# Patient Record
Sex: Female | Born: 1985 | Race: Black or African American | Hispanic: No | Marital: Married | Smoking: Never smoker
Health system: Southern US, Community
[De-identification: ages and names within clinical notes are randomized; demographics above are authoritative.]

## PROBLEM LIST (undated history)

## (undated) DIAGNOSIS — N939 Abnormal uterine and vaginal bleeding, unspecified: Secondary | ICD-10-CM

## (undated) DIAGNOSIS — A749 Chlamydial infection, unspecified: Secondary | ICD-10-CM

## (undated) DIAGNOSIS — Z9851 Tubal ligation status: Secondary | ICD-10-CM

## (undated) DIAGNOSIS — T7840XA Allergy, unspecified, initial encounter: Secondary | ICD-10-CM

## (undated) DIAGNOSIS — M419 Scoliosis, unspecified: Secondary | ICD-10-CM

## (undated) DIAGNOSIS — A6000 Herpesviral infection of urogenital system, unspecified: Secondary | ICD-10-CM

## (undated) DIAGNOSIS — D509 Iron deficiency anemia, unspecified: Secondary | ICD-10-CM

## (undated) DIAGNOSIS — O365990 Maternal care for other known or suspected poor fetal growth, unspecified trimester, not applicable or unspecified: Secondary | ICD-10-CM

## (undated) DIAGNOSIS — Z3480 Encounter for supervision of other normal pregnancy, unspecified trimester: Secondary | ICD-10-CM

## (undated) DIAGNOSIS — G43909 Migraine, unspecified, not intractable, without status migrainosus: Secondary | ICD-10-CM

## (undated) HISTORY — DX: Scoliosis, unspecified: M41.9

## (undated) HISTORY — PX: MYRINGOTOMY WITH TUBE PLACEMENT: SHX5663

## (undated) HISTORY — DX: Allergy, unspecified, initial encounter: T78.40XA

## (undated) HISTORY — DX: Chlamydial infection, unspecified: A74.9

## (undated) HISTORY — DX: Herpesviral infection of urogenital system, unspecified: A60.00

## (undated) HISTORY — DX: Migraine, unspecified, not intractable, without status migrainosus: G43.909

---

## 2008-05-11 DIAGNOSIS — Z8619 Personal history of other infectious and parasitic diseases: Secondary | ICD-10-CM

## 2008-05-11 HISTORY — DX: Personal history of other infectious and parasitic diseases: Z86.19

## 2008-11-15 ENCOUNTER — Emergency Department (HOSPITAL_COMMUNITY): Admission: EM | Admit: 2008-11-15 | Discharge: 2008-12-02 | Payer: Self-pay | Admitting: Emergency Medicine

## 2009-03-08 ENCOUNTER — Encounter: Admission: RE | Admit: 2009-03-08 | Discharge: 2009-03-08 | Payer: Self-pay | Admitting: Internal Medicine

## 2010-08-19 ENCOUNTER — Inpatient Hospital Stay (HOSPITAL_COMMUNITY)
Admission: AD | Admit: 2010-08-19 | Discharge: 2010-08-21 | DRG: 372 | Disposition: A | Discharge status: Home / Self Care | Payer: BC Managed Care – PPO | Source: Ambulatory Visit | Attending: Obstetrics and Gynecology | Admitting: Obstetrics and Gynecology

## 2010-08-19 DIAGNOSIS — O4290 Premature rupture of membranes, unspecified as to length of time between rupture and onset of labor, unspecified weeks of gestation: Principal | ICD-10-CM | POA: Diagnosis present

## 2010-08-19 DIAGNOSIS — Z22330 Carrier of Group B streptococcus: Secondary | ICD-10-CM

## 2010-08-19 DIAGNOSIS — O99892 Other specified diseases and conditions complicating childbirth: Secondary | ICD-10-CM | POA: Diagnosis present

## 2010-08-19 LAB — CBC
Hemoglobin: 12.2 g/dL (ref 12.0–15.0)
MCH: 28 pg (ref 26.0–34.0)
MCHC: 32.4 g/dL (ref 30.0–36.0)
MCV: 86.7 fL (ref 78.0–100.0)

## 2010-08-21 LAB — CBC
HCT: 34.8 % — ABNORMAL LOW (ref 36.0–46.0)
Platelets: 199 10*3/uL (ref 150–400)
RBC: 4.03 MIL/uL (ref 3.87–5.11)

## 2011-09-14 ENCOUNTER — Telehealth: Payer: Self-pay

## 2011-09-14 MED ORDER — NORGESTIMATE-ETH ESTRADIOL 0.25-35 MG-MCG PO TABS: 1.0000 | ORAL_TABLET | Freq: Every day | ORAL | Status: DC

## 2011-09-14 NOTE — Telephone Encounter (Signed)
rx'd

## 2011-09-14 NOTE — Telephone Encounter (Signed)
Called [facility] and was told that pt was on Errin prior to a couple of months ago but was switched dt back order on Errin, but pt would have been on Errin while breastfeeding. Only other record we have before pregnancy was orthocyclen 1 QD Rx'd for 1 year on Mar 05, 2011. Verify w/pt if this is the BCP she would like to switch back to. LMOM for pt to CB.

## 2011-09-14 NOTE — Telephone Encounter (Signed)
Patient aware.

## 2011-09-14 NOTE — Telephone Encounter (Signed)
PT STATES SHE WAS GIVEN A DIFFERENT KIND OF BCP'S SINCE SHE WAS BREASTFEEDING, SHE HAVE FINISHED BREAST FEEDING AND WOULD LIKE TO GO BACK TO THE ORIGINAL ONES SHE TOOK BEFORE HER BABY. SHE COULDN'T THINK OF THE NAME OF IT AND THE PHARMACY WOULDN'T SURE SCRIPT FOR HER PLEASE CALL [phone]    [facility] ON [address]

## 2011-09-14 NOTE — Telephone Encounter (Signed)
Spoke with patient and she would like rx for the Ortho cyclen rx'd to [facility].  She spoke with the pharm and they state that she did not have any refilled... ?d'c'd when changed to Errin?  Ok to rx through October?  Patient will recheck then for her yearly.Marland KitchenMarland Kitchen

## 2011-09-19 ENCOUNTER — Other Ambulatory Visit: Payer: Self-pay | Admitting: Family Medicine

## 2012-01-12 ENCOUNTER — Ambulatory Visit (INDEPENDENT_AMBULATORY_CARE_PROVIDER_SITE_OTHER): Payer: BC Managed Care – PPO | Admitting: Family Medicine

## 2012-01-12 VITALS — BP 114/77 | HR 80 | Temp 98.9°F | Resp 16 | Ht 65.5 in | Wt 154.0 lb

## 2012-01-12 DIAGNOSIS — R300 Dysuria: Secondary | ICD-10-CM

## 2012-01-12 DIAGNOSIS — N210 Calculus in bladder: Secondary | ICD-10-CM

## 2012-01-12 DIAGNOSIS — R350 Frequency of micturition: Secondary | ICD-10-CM

## 2012-01-12 DIAGNOSIS — N390 Urinary tract infection, site not specified: Secondary | ICD-10-CM | POA: Insufficient documentation

## 2012-01-12 LAB — POCT URINALYSIS DIPSTICK
Glucose, UA: 250
Spec Grav, UA: 1.01

## 2012-01-12 LAB — POCT UA - MICROSCOPIC ONLY
Bacteria, U Microscopic: NEGATIVE
Crystals, Ur, HPF, POC: NEGATIVE
Mucus, UA: NEGATIVE
Yeast, UA: NEGATIVE

## 2012-01-12 MED ORDER — CEPHALEXIN 500 MG PO CAPS: 500.0000 mg | ORAL_CAPSULE | Freq: Two times a day (BID) | ORAL | Status: AC

## 2012-01-12 NOTE — Patient Instructions (Addendum)
Thank you for coming in today. Take the keflex twice a day for 1 week. Let us know if you get worse or have bad fevers or chills or belly or back pain.

## 2012-01-12 NOTE — Progress Notes (Signed)
Ashley [name] is a 26 y.o. female who presents to [facility] today for urinary tract infection symptoms. Started yesterday patient notes pelvic pain urinary frequency and urgency. Her symptoms improved after taking AZO.  She feels well denies any fevers chills significant abdominal or flank pain. She has a history of one prior UTI. This is consistent with her symptoms.   PMH: Reviewed otherwise healthy History  Substance Use Topics  . Smoking status: Never Smoker   . Smokeless tobacco: Not on file  . Alcohol Use: Not on file   ROS as above  Medications reviewed. Current Outpatient Prescriptions  Medication Sig Dispense Refill  . norgestimate-ethinyl estradiol (ORTHO-CYCLEN,SPRINTEC,PREVIFEM) 0.25-35 MG-MCG tablet Take 1 tablet by mouth daily.  1 Package  4  . cephALEXin (KEFLEX) 500 MG capsule Take 1 capsule (500 mg total) by mouth 2 (two) times daily.  14 capsule  0    Exam:  BP 114/77  Pulse 80  Temp 98.9 F (37.2 C)  Resp 16  Ht 5' 5.5" (1.664 m)  Wt 154 lb (69.854 kg)  BMI 25.24 kg/m2  LMP 12/29/2011 Gen: Well NAD HEENT: EOMI,  MMM Lungs: CTABL Nl WOB Heart: RRR no MRG Abd: NABS, NT, ND Exts: Non edematous BL  LE, warm and well perfused.   Results for orders placed in visit on 01/12/12 (from the past 72 hour(s))  POCT URINALYSIS DIPSTICK     Status: Normal   Collection Time   01/12/12  6:43 PM      Component Value Range Comment   Color, UA orange      Clarity, UA clear      Glucose, UA 250      Bilirubin, UA small      Ketones, UA trace      Spec Grav, UA 1.010      Blood, UA trace      pH, UA 5.0      Protein, UA 100      Urobilinogen, UA 4.0      Nitrite, UA postive      Leukocytes, UA large (3+)     POCT UA - MICROSCOPIC ONLY     Status: Normal   Collection Time   01/12/12  6:43 PM      Component Value Range Comment   WBC, Ur, HPF, POC 10-12      RBC, urine, microscopic 0-3      Bacteria, U Microscopic neg      Mucus, UA neg      Epithelial cells, urine per micros  2-4      Crystals, Ur, HPF, POC neg      Casts, Ur, LPF, POC neg      Yeast, UA neg     POCT URINE PREGNANCY     Status: Normal   Collection Time   01/12/12  6:45 PM      Component Value Range Comment   Preg Test, Ur Negative       Assessment and Plan: 26 y.o. female with UTI. Based on urine microscopy and dipstick. Plan to treat with Keflex twice daily for one week. Discussed warning signs or symptoms followup as needed. Patient expresses understanding.

## 2012-01-23 ENCOUNTER — Telehealth: Payer: Self-pay

## 2012-01-23 MED ORDER — FLUCONAZOLE 150 MG PO TABS: 150.0000 mg | ORAL_TABLET | Freq: Once | ORAL | Status: AC

## 2012-01-23 NOTE — Telephone Encounter (Signed)
Diflucan prescription sent to pharmacy.  Tell her to take it today and it will take several days for her symptoms to resolve

## 2012-01-23 NOTE — Telephone Encounter (Signed)
Pt was treated here for a UTI and now has a yeast infection wants to know if she can get an antibiotic Pt uses [facility] [address]

## 2012-01-23 NOTE — Telephone Encounter (Signed)
LMOM RX sent to pharmacy 

## 2012-01-25 ENCOUNTER — Other Ambulatory Visit: Payer: Self-pay | Admitting: Physician Assistant

## 2012-03-09 ENCOUNTER — Ambulatory Visit (INDEPENDENT_AMBULATORY_CARE_PROVIDER_SITE_OTHER): Payer: BC Managed Care – PPO | Admitting: Family Medicine

## 2012-03-09 ENCOUNTER — Ambulatory Visit: Payer: BC Managed Care – PPO

## 2012-03-09 VITALS — BP 124/76 | HR 71 | Temp 98.3°F | Resp 17 | Ht 66.0 in | Wt 154.0 lb

## 2012-03-09 DIAGNOSIS — Z01419 Encounter for gynecological examination (general) (routine) without abnormal findings: Secondary | ICD-10-CM

## 2012-03-09 DIAGNOSIS — B373 Candidiasis of vulva and vagina: Secondary | ICD-10-CM

## 2012-03-09 DIAGNOSIS — M545 Low back pain: Secondary | ICD-10-CM

## 2012-03-09 DIAGNOSIS — L259 Unspecified contact dermatitis, unspecified cause: Secondary | ICD-10-CM

## 2012-03-09 DIAGNOSIS — R42 Dizziness and giddiness: Secondary | ICD-10-CM

## 2012-03-09 DIAGNOSIS — Z0000 Encounter for general adult medical examination without abnormal findings: Secondary | ICD-10-CM

## 2012-03-09 LAB — POCT URINALYSIS DIPSTICK
Bilirubin, UA: NEGATIVE
Glucose, UA: NEGATIVE
Leukocytes, UA: NEGATIVE
Urobilinogen, UA: 1

## 2012-03-09 LAB — POCT CBC
Granulocyte percent: 52.7 %G (ref 37–80)
Hemoglobin: 12.4 g/dL (ref 12.2–16.2)
Lymph, poc: 2.6 (ref 0.6–3.4)
MCH, POC: 26.4 pg — AB (ref 27–31.2)
MCV: 86.8 fL (ref 80–97)
MID (cbc): 0.5 (ref 0–0.9)
POC Granulocyte: 3.4 (ref 2–6.9)
POC LYMPH PERCENT: 39.3 %L (ref 10–50)
RDW, POC: 13.2 %
WBC: 6.5 10*3/uL (ref 4.6–10.2)

## 2012-03-09 LAB — POCT GLYCOSYLATED HEMOGLOBIN (HGB A1C): Hemoglobin A1C: 4.9

## 2012-03-09 LAB — POCT UA - MICROSCOPIC ONLY

## 2012-03-09 LAB — POCT WET PREP WITH KOH

## 2012-03-09 MED ORDER — NORGESTIMATE-ETH ESTRADIOL 0.25-35 MG-MCG PO TABS: 1.0000 | ORAL_TABLET | Freq: Every day | ORAL | Status: DC

## 2012-03-09 NOTE — Progress Notes (Signed)
[address]   [place], [place]  [address]   [phone]  Subjective:    Patient ID: Ashley [name], female    DOB: August 04, 1985, 26 y.o.   MRN: [id number]  HPIThis 26 y.o. female presents for evaluation for CPE.  Last CPE 03/03/11.  Last pap smear 02/2011 normal.  Mammogram never; breast u/s  2010 for cysts.  Colonoscopy never.  TDAP 2010.  Hepatitis B series in school.  Gardisil series never. Influenza never.  Eye exam several years; no glasses.  Dental exam 2-3 years.  Abnormal pap smear 2006; repeat pap normal.  1.  Contraception:  Has suffered with recurrent yeast infections since birth of daughter.  Now taking new contraception.  Before pregnancy took a different OCP.   2. Back pain: has suffered with back pain since birth of daughter; only occurs at night.  Lower back.  No radiation into legs.  No associated n/t/w.  No b/b dysfunction; no saddle paresthesias. 3.  Light headed, dizziness: onset two days ago. Wants iron checked.  No syncope.  No head congestion. 4.  Rash L arm: presented to [facility]; prescribed cream with recurrence.  Switched watch.  Mild itching.     Review of Systems  Constitutional: Negative for fever, chills, diaphoresis, activity change, appetite change, fatigue and unexpected weight change.  HENT: Negative for hearing loss, ear pain, nosebleeds, congestion, sore throat, facial swelling, rhinorrhea, sneezing, drooling, mouth sores, trouble swallowing, neck pain, neck stiffness, dental problem, voice change, postnasal drip, tinnitus and ear discharge.   Eyes: Negative for photophobia, pain, discharge, redness, itching and visual disturbance.  Respiratory: Negative for apnea, cough, choking, chest tightness, shortness of breath, wheezing and stridor.   Cardiovascular: Negative for chest pain, palpitations and leg swelling.  Gastrointestinal: Negative for nausea, vomiting, abdominal pain, diarrhea, constipation, blood in stool, abdominal distention, anal bleeding and  rectal pain.  Genitourinary: Positive for vaginal discharge. Negative for dysuria, urgency, frequency, hematuria, flank pain, decreased urine volume, vaginal bleeding, enuresis, difficulty urinating, genital sores, vaginal pain, menstrual problem, pelvic pain and dyspareunia.  Musculoskeletal: Positive for back pain. Negative for joint swelling, arthralgias and gait problem.  Skin: Positive for rash. Negative for color change, pallor and wound.  Neurological: Positive for dizziness and light-headedness. Negative for tremors, seizures, syncope, facial asymmetry, speech difficulty, weakness, numbness and headaches.  Hematological: Negative for adenopathy. Does not bruise/bleed easily.  Psychiatric/Behavioral: Negative for suicidal ideas, hallucinations, behavioral problems, confusion, disturbed wake/sleep cycle, self-injury, dysphoric mood, decreased concentration and agitation. The patient is not nervous/anxious and is not hyperactive.         Past Medical History  Diagnosis Date  . Allergy     Zyrtec daily.  . Genital herpes     HSV II.  . Migraine   . Chlamydia 05/11/2008    Past Surgical History  Procedure Date  . Myringotomy with tube placement     Prior to Admission medications   Medication Sig Start Date End Date Taking? Authorizing Provider  norgestimate-ethinyl estradiol (ORTHO-CYCLEN, 28,) 0.25-35 MG-MCG tablet Take 1 tablet by mouth daily. PLEASE PLACE ON HOLD. 03/09/12   Ethelda Chick, MD    No Known Allergies  History   Social History  . Marital Status: Married    Spouse Name: N/A    Number of Children: N/A  . Years of Education: N/A   Occupational History  . Not on file.   Social History Main Topics  . Smoking status: Never Smoker   . Smokeless tobacco:  Not on file  . Alcohol Use: No  . Drug Use: Not on file  . Sexually Active: Yes    Birth Control/ Protection: Pill   Other Topics Concern  . Not on file   Social History Narrative   Marital status:  married x 2 years; happily married; no abuse.    Children: 1 daughter (37 months)   Lives: with husband, daughter.  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  Tobacco: none   Alcohol: none   Drugs: none   Exercise:       Family History  Problem Relation Age of Onset  . Hypertension Mother   . Asthma Mother   . Hypertension Maternal Grandmother     Objective:   Physical Exam  Nursing note reviewed. Constitutional: She is oriented to person, place, and time. She appears well-developed and well-nourished. No distress.  HENT:  Head: Normocephalic and atraumatic.  Right Ear: External ear normal.  Left Ear: External ear normal.  Nose: Nose normal.  Mouth/Throat: Oropharynx is clear and moist.  Eyes: Conjunctivae normal and EOM are normal. Pupils are equal, round, and reactive to light.  Neck: Normal range of motion. Neck supple. No JVD present. No thyromegaly present.  Cardiovascular: Normal rate, regular rhythm and normal heart sounds.  Exam reveals no gallop and no friction rub.   No murmur heard. Pulmonary/Chest: Effort normal and breath sounds normal. No respiratory distress. She has no wheezes. She has no rales.  Abdominal: Soft. Bowel sounds are normal. She exhibits no distension and no mass. There is no tenderness. There is no rebound and no guarding. Hernia confirmed negative in the right inguinal area and confirmed negative in the left inguinal area.  Genitourinary: Uterus normal. No breast swelling, tenderness or discharge. There is no rash, tenderness or lesion on the right labia. There is no rash, tenderness or lesion on the left labia. Cervix exhibits discharge. Cervix exhibits no motion tenderness and no friability. Right adnexum displays no mass, no tenderness and no fullness. Left adnexum displays no mass, no tenderness and no fullness. No erythema, tenderness or bleeding around the vagina. No foreign body around the vagina. Vaginal discharge found.  Musculoskeletal:  Normal range of motion. She exhibits no tenderness.       Lumbar back: She exhibits no tenderness, no bony tenderness, no swelling, no pain and no spasm.       LUMBAR SPINE: NON-TENDER; STRAIGHT LEG RAISES NEGATIVE; MOTOR 5/5.  Lymphadenopathy:    She has no cervical adenopathy.       Right: No inguinal adenopathy present.       Left: No inguinal adenopathy present.  Neurological: She is alert and oriented to person, place, and time. She has normal reflexes. No cranial nerve deficit. She exhibits normal muscle tone. Coordination normal.  Skin: Skin is warm and dry. She is not diaphoretic.       +erythematous rash wrist.  Psychiatric: She has a normal mood and affect. Her behavior is normal. Judgment and thought content normal.    Results for orders placed in visit on 03/09/12  POCT CBC      Component Value Range   WBC 6.5  4.6 - 10.2 K/uL   Lymph, poc 2.6  0.6 - 3.4   POC LYMPH PERCENT 39.3  10 - 50 %L   MID (cbc) 0.5  0 - 0.9   POC MID % 8.0  0 - 12 %M   POC Granulocyte 3.4  2 - 6.9  Granulocyte percent 52.7  37 - 80 %G   RBC 4.70  4.04 - 5.48 M/uL   Hemoglobin 12.4  12.2 - 16.2 g/dL   HCT, POC 40.9  81.1 - 47.9 %   MCV 86.8  80 - 97 fL   MCH, POC 26.4 (*) 27 - 31.2 pg   MCHC 30.4 (*) 31.8 - 35.4 g/dL   RDW, POC 91.4     Platelet Count, POC 294  142 - 424 K/uL   MPV 8.6  0 - 99.8 fL  POCT GLYCOSYLATED HEMOGLOBIN (HGB A1C)      Component Value Range   Hemoglobin A1C 4.9    POCT UA - MICROSCOPIC ONLY      Component Value Range   WBC, Ur, HPF, POC 0-2     RBC, urine, microscopic neg     Bacteria, U Microscopic neg     Mucus, UA neg     Epithelial cells, urine per micros 0-2     Crystals, Ur, HPF, POC neg     Casts, Ur, LPF, POC neg     Yeast, UA neg    POCT URINALYSIS DIPSTICK      Component Value Range   Color, UA yellow     Clarity, UA slightly cloudy     Glucose, UA neg     Bilirubin, UA neg     Ketones, UA neg     Spec Grav, UA 1.020     Blood, UA neg     pH,  UA 7.5     Protein, UA trace     Urobilinogen, UA 1.0     Nitrite, UA neg     Leukocytes, UA Negative    POCT WET PREP WITH KOH      Component Value Range   Trichomonas, UA Negative     Clue Cells Wet Prep HPF POC 1-3     Epithelial Wet Prep HPF POC 3-6     Yeast Wet Prep HPF POC neg     Bacteria Wet Prep HPF POC 2+     RBC Wet Prep HPF POC 0-1     WBC Wet Prep HPF POC 2-4     KOH Prep POC Negative     [facility] reading (PRIMARY) by  Dr. Katrinka Blazing.  LS spine: scoliosis; narrowing/degenerative changes L2-3, L3-4.      Assessment & Plan:   1. Routine general medical examination at a health care facility  POCT CBC, POCT glycosylated hemoglobin (Hb A1C), POCT UA - Microscopic Only, POCT urinalysis dipstick, Comprehensive metabolic panel, Vitamin D 25 hydroxy, Vitamin B12, TSH, Iron, IBC Panel  2. Routine gynecological examination  Pap IG and HPV (high risk) DNA detection  3. Low back pain  DG Lumbar Spine 2-3 Views  4. Candidiasis of female genitalia  POCT glycosylated hemoglobin (Hb A1C), POCT Wet Prep with KOH  5. Dizziness  Iron, IBC Panel  6. Contact dermatitis       1. CPE:  Anticipatory guidance --- exercise.  Pap smear obtained; obtain labs.  Immunizations recommended; pt declined flu vaccine. 2.  Gynecological exam:  Pap smear obtained.  Rx for contraception provided. 3. Low back pain: New; onset after childbirth.  Exercises provided to perform daily. If no improvement in upcoming 1-3 months, call for ortho referral. 4. Dizziness: New.  CBC normal; obtain labs.  Increase fluid intake.  Normal neuro exam. 6. Contact dermatitis:  New.  To call with rx previously prescribed.     Meds ordered this  encounter  Medications  . norgestimate-ethinyl estradiol (ORTHO-CYCLEN, 28,) 0.25-35 MG-MCG tablet    Sig: Take 1 tablet by mouth daily. PLEASE PLACE ON HOLD.    Dispense:  1 Package    Refill:  11

## 2012-03-09 NOTE — Patient Instructions (Addendum)
1. Routine general medical examination at a health care facility  POCT CBC, POCT glycosylated hemoglobin (Hb A1C), POCT UA - Microscopic Only, POCT urinalysis dipstick, Comprehensive metabolic panel, Vitamin D 25 hydroxy, Vitamin B12, TSH, Iron, IBC Panel  2. Routine gynecological examination  Pap IG and HPV (high risk) DNA detection  3. Low back pain  DG Lumbar Spine 2-3 Views  4. Candidiasis of female genitalia  POCT glycosylated hemoglobin (Hb A1C), POCT Wet Prep with KOH  5. Dizziness  Iron, IBC Panel  6. Contact dermatitis     Low Back Sprain with Rehab  A sprain is an injury in which a ligament is torn. The ligaments of the lower back are vulnerable to sprains. However, they are strong and require great force to be injured. These ligaments are important for stabilizing the spinal column. Sprains are classified into three categories. Grade 1 sprains cause pain, but the tendon is not lengthened. Grade 2 sprains include a lengthened ligament, due to the ligament being stretched or partially ruptured. With grade 2 sprains there is still function, although the function may be decreased. Grade 3 sprains involve a complete tear of the tendon or muscle, and function is usually impaired. SYMPTOMS   Severe pain in the lower back.  Sometimes, a feeling of a "pop," "snap," or tear, at the time of injury.  Tenderness and sometimes swelling at the injury site.  Uncommonly, bruising (contusion) within 48 hours of injury.  Muscle spasms in the back. CAUSES  Low back sprains occur when a force is placed on the ligaments that is greater than they can handle. Common causes of injury include:  Performing a stressful act while off-balance.  Repetitive stressful activities that involve movement of the lower back.  Direct hit (trauma) to the lower back. RISK INCREASES WITH:  Contact sports (football, wrestling).  Collisions (major skiing accidents).  Sports that require throwing or lifting  (baseball, weightlifting).  Sports involving twisting of the spine (gymnastics, diving, tennis, golf).  Poor strength and flexibility.  Inadequate protection.  Previous back injury or surgery (especially fusion). PREVENTION  Wear properly fitted and padded protective equipment.  Warm up and stretch properly before activity.  Allow for adequate recovery between workouts.  Maintain physical fitness:  Strength, flexibility, and endurance.  Cardiovascular fitness.  Maintain a healthy body weight. PROGNOSIS  If treated properly, low back sprains usually heal with non-surgical treatment. The length of time for healing depends on the severity of the injury.  RELATED COMPLICATIONS   Recurring symptoms, resulting in a chronic problem.  Chronic inflammation and pain in the low back.  Delayed healing or resolution of symptoms, especially if activity is resumed too soon.  Prolonged impairment.  Unstable or arthritic joints of the low back. TREATMENT  Treatment first involves the use of ice and medicine, to reduce pain and inflammation. The use of strengthening and stretching exercises may help reduce pain with activity. These exercises may be performed at home or with a therapist. Severe injuries may require referral to a therapist for further evaluation and treatment, such as ultrasound. Your caregiver may advise that you wear a back brace or corset, to help reduce pain and discomfort. Often, prolonged bed rest results in greater harm then benefit. Corticosteroid injections may be recommended. However, these should be reserved for the most serious cases. It is important to avoid using your back when lifting objects. At night, sleep on your back on a firm mattress, with a pillow placed under your knees. If  non-surgical treatment is unsuccessful, surgery may be needed.  MEDICATION   If pain medicine is needed, nonsteroidal anti-inflammatory medicines (aspirin and ibuprofen), or other  minor pain relievers (acetaminophen), are often advised.  Do not take pain medicine for 7 days before surgery.  Prescription pain relievers may be given, if your caregiver thinks they are needed. Use only as directed and only as much as you need.  Ointments applied to the skin may be helpful.  Corticosteroid injections may be given by your caregiver. These injections should be reserved for the most serious cases, because they may only be given a certain number of times. HEAT AND COLD  Cold treatment (icing) should be applied for 10 to 15 minutes every 2 to 3 hours for inflammation and pain, and immediately after activity that aggravates your symptoms. Use ice packs or an ice massage.  Heat treatment may be used before performing stretching and strengthening activities prescribed by your caregiver, physical therapist, or athletic trainer. Use a heat pack or a warm water soak. SEEK MEDICAL CARE IF:   Symptoms get worse or do not improve in 2 to 4 weeks, despite treatment.  You develop numbness or weakness in either leg.  You lose bowel or bladder function.  Any of the following occur after surgery: fever, increased pain, swelling, redness, drainage of fluids, or bleeding in the affected area.  New, unexplained symptoms develop. (Drugs used in treatment may produce side effects.) EXERCISES  RANGE OF MOTION (ROM) AND STRETCHING EXERCISES - Low Back Sprain Most people with lower back pain will find that their symptoms get worse with excessive bending forward (flexion) or arching at the lower back (extension). The exercises that will help resolve your symptoms will focus on the opposite motion.  Your physician, physical therapist or athletic trainer will help you determine which exercises will be most helpful to resolve your lower back pain. Do not complete any exercises without first consulting with your caregiver. Discontinue any exercises which make your symptoms worse, until you speak to  your caregiver. If you have pain, numbness or tingling which travels down into your buttocks, leg or foot, the goal of the therapy is for these symptoms to move closer to your back and eventually resolve. Sometimes, these leg symptoms will get better, but your lower back pain may worsen. This is often an indication of progress in your rehabilitation. Be very alert to any changes in your symptoms and the activities in which you participated in the 24 hours prior to the change. Sharing this information with your caregiver will allow him or her to most efficiently treat your condition. These exercises may help you when beginning to rehabilitate your injury. Your symptoms may resolve with or without further involvement from your physician, physical therapist or athletic trainer. While completing these exercises, remember:   Restoring tissue flexibility helps normal motion to return to the joints. This allows healthier, less painful movement and activity.  An effective stretch should be held for at least 30 seconds.  A stretch should never be painful. You should only feel a gentle lengthening or release in the stretched tissue. FLEXION RANGE OF MOTION AND STRETCHING EXERCISES: STRETCH  Flexion, Single Knee to Chest   Lie on a firm bed or floor with both legs extended in front of you.  Keeping one leg in contact with the floor, bring your opposite knee to your chest. Hold your leg in place by either grabbing behind your thigh or at your knee.  Pull until  you feel a gentle stretch in your low back. Hold __________ seconds.  Slowly release your grasp and repeat the exercise with the opposite side. Repeat __________ times. Complete this exercise __________ times per day.  STRETCH  Flexion, Double Knee to Chest  Lie on a firm bed or floor with both legs extended in front of you.  Keeping one leg in contact with the floor, bring your opposite knee to your chest.  Tense your stomach muscles to support  your back and then lift your other knee to your chest. Hold your legs in place by either grabbing behind your thighs or at your knees.  Pull both knees toward your chest until you feel a gentle stretch in your low back. Hold __________ seconds.  Tense your stomach muscles and slowly return one leg at a time to the floor. Repeat __________ times. Complete this exercise __________ times per day.  STRETCH  Low Trunk Rotation  Lie on a firm bed or floor. Keeping your legs in front of you, bend your knees so they are both pointed toward the ceiling and your feet are flat on the floor.  Extend your arms out to the side. This will stabilize your upper body by keeping your shoulders in contact with the floor.  Gently and slowly drop both knees together to one side until you feel a gentle stretch in your low back. Hold for __________ seconds.  Tense your stomach muscles to support your lower back as you bring your knees back to the starting position. Repeat the exercise to the other side. Repeat __________ times. Complete this exercise __________ times per day  EXTENSION RANGE OF MOTION AND FLEXIBILITY EXERCISES: STRETCH  Extension, Prone on Elbows   Lie on your stomach on the floor, a bed will be too soft. Place your palms about shoulder width apart and at the height of your head.  Place your elbows under your shoulders. If this is too painful, stack pillows under your chest.  Allow your body to relax so that your hips drop lower and make contact more completely with the floor.  Hold this position for __________ seconds.  Slowly return to lying flat on the floor. Repeat __________ times. Complete this exercise __________ times per day.  RANGE OF MOTION  Extension, Prone Press Ups  Lie on your stomach on the floor, a bed will be too soft. Place your palms about shoulder width apart and at the height of your head.  Keeping your back as relaxed as possible, slowly straighten your elbows while  keeping your hips on the floor. You may adjust the placement of your hands to maximize your comfort. As you gain motion, your hands will come more underneath your shoulders.  Hold this position __________ seconds.  Slowly return to lying flat on the floor. Repeat __________ times. Complete this exercise __________ times per day.  RANGE OF MOTION- Quadruped, Neutral Spine   Assume a hands and knees position on a firm surface. Keep your hands under your shoulders and your knees under your hips. You may place padding under your knees for comfort.  Drop your head and point your tailbone toward the ground below you. This will round out your lower back like an angry cat. Hold this position for __________ seconds.  Slowly lift your head and release your tail bone so that your back sags into a large arch, like an old horse.  Hold this position for __________ seconds.  Repeat this until you feel limber in  your low back.  Now, find your "sweet spot." This will be the most comfortable position somewhere between the two previous positions. This is your neutral spine. Once you have found this position, tense your stomach muscles to support your low back.  Hold this position for __________ seconds. Repeat __________ times. Complete this exercise __________ times per day.  STRENGTHENING EXERCISES - Low Back Sprain These exercises may help you when beginning to rehabilitate your injury. These exercises should be done near your "sweet spot." This is the neutral, low-back arch, somewhere between fully rounded and fully arched, that is your least painful position. When performed in this safe range of motion, these exercises can be used for people who have either a flexion or extension based injury. These exercises may resolve your symptoms with or without further involvement from your physician, physical therapist or athletic trainer. While completing these exercises, remember:   Muscles can gain both the  endurance and the strength needed for everyday activities through controlled exercises.  Complete these exercises as instructed by your physician, physical therapist or athletic trainer. Increase the resistance and repetitions only as guided.  You may experience muscle soreness or fatigue, but the pain or discomfort you are trying to eliminate should never worsen during these exercises. If this pain does worsen, stop and make certain you are following the directions exactly. If the pain is still present after adjustments, discontinue the exercise until you can discuss the trouble with your caregiver. STRENGTHENING Deep Abdominals, Pelvic Tilt   Lie on a firm bed or floor. Keeping your legs in front of you, bend your knees so they are both pointed toward the ceiling and your feet are flat on the floor.  Tense your lower abdominal muscles to press your low back into the floor. This motion will rotate your pelvis so that your tail bone is scooping upwards rather than pointing at your feet or into the floor. With a gentle tension and even breathing, hold this position for __________ seconds. Repeat __________ times. Complete this exercise __________ times per day.  STRENGTHENING  Abdominals, Crunches   Lie on a firm bed or floor. Keeping your legs in front of you, bend your knees so they are both pointed toward the ceiling and your feet are flat on the floor. Cross your arms over your chest.  Slightly tip your chin down without bending your neck.  Tense your abdominals and slowly lift your trunk high enough to just clear your shoulder blades. Lifting higher can put excessive stress on the lower back and does not further strengthen your abdominal muscles.  Control your return to the starting position. Repeat __________ times. Complete this exercise __________ times per day.  STRENGTHENING  Quadruped, Opposite UE/LE Lift   Assume a hands and knees position on a firm surface. Keep your hands under  your shoulders and your knees under your hips. You may place padding under your knees for comfort.  Find your neutral spine and gently tense your abdominal muscles so that you can maintain this position. Your shoulders and hips should form a rectangle that is parallel with the floor and is not twisted.  Keeping your trunk steady, lift your right hand no higher than your shoulder and then your left leg no higher than your hip. Make sure you are not holding your breath. Hold this position for __________ seconds.  Continuing to keep your abdominal muscles tense and your back steady, slowly return to your starting position. Repeat with the opposite  arm and leg. Repeat __________ times. Complete this exercise __________ times per day.  STRENGTHENING  Abdominals and Quadriceps, Straight Leg Raise   Lie on a firm bed or floor with both legs extended in front of you.  Keeping one leg in contact with the floor, bend the other knee so that your foot can rest flat on the floor.  Find your neutral spine, and tense your abdominal muscles to maintain your spinal position throughout the exercise.  Slowly lift your straight leg off the floor about 6 inches for a count of 15, making sure to not hold your breath.  Still keeping your neutral spine, slowly lower your leg all the way to the floor. Repeat this exercise with each leg __________ times. Complete this exercise __________ times per day. POSTURE AND BODY MECHANICS CONSIDERATIONS - Low Back Sprain Keeping correct posture when sitting, standing or completing your activities will reduce the stress put on different body tissues, allowing injured tissues a chance to heal and limiting painful experiences. The following are general guidelines for improved posture. Your physician or physical therapist will provide you with any instructions specific to your needs. While reading these guidelines, remember:  The exercises prescribed by your provider will help you  have the flexibility and strength to maintain correct postures.  The correct posture provides the best environment for your joints to work. All of your joints have less wear and tear when properly supported by a spine with good posture. This means you will experience a healthier, less painful body.  Correct posture must be practiced with all of your activities, especially prolonged sitting and standing. Correct posture is as important when doing repetitive low-stress activities (typing) as it is when doing a single heavy-load activity (lifting). RESTING POSITIONS Consider which positions are most painful for you when choosing a resting position. If you have pain with flexion-based activities (sitting, bending, stooping, squatting), choose a position that allows you to rest in a less flexed posture. You would want to avoid curling into a fetal position on your side. If your pain worsens with extension-based activities (prolonged standing, working overhead), avoid resting in an extended position such as sleeping on your stomach. Most people will find more comfort when they rest with their spine in a more neutral position, neither too rounded nor too arched. Lying on a non-sagging bed on your side with a pillow between your knees, or on your back with a pillow under your knees will often provide some relief. Keep in mind, being in any one position for a prolonged period of time, no matter how correct your posture, can still lead to stiffness. PROPER SITTING POSTURE In order to minimize stress and discomfort on your spine, you must sit with correct posture. Sitting with good posture should be effortless for a healthy body. Returning to good posture is a gradual process. Many people can work toward this most comfortably by using various supports until they have the flexibility and strength to maintain this posture on their own. When sitting with proper posture, your ears will fall over your shoulders and your  shoulders will fall over your hips. You should use the back of the chair to support your upper back. Your lower back will be in a neutral position, just slightly arched. You may place a small pillow or folded towel at the base of your lower back for  support.  When working at a desk, create an environment that supports good, upright posture. Without extra support, muscles tire,  which leads to excessive strain on joints and other tissues. Keep these recommendations in mind: CHAIR:  A chair should be able to slide under your desk when your back makes contact with the back of the chair. This allows you to work closely.  The chair's height should allow your eyes to be level with the upper part of your monitor and your hands to be slightly lower than your elbows. BODY POSITION  Your feet should make contact with the floor. If this is not possible, use a foot rest.  Keep your ears over your shoulders. This will reduce stress on your neck and low back. INCORRECT SITTING POSTURES  If you are feeling tired and unable to assume a healthy sitting posture, do not slouch or slump. This puts excessive strain on your back tissues, causing more damage and pain. Healthier options include:  Using more support, like a lumbar pillow.  Switching tasks to something that requires you to be upright or walking.  Talking a brief walk.  Lying down to rest in a neutral-spine position. PROLONGED STANDING WHILE SLIGHTLY LEANING FORWARD  When completing a task that requires you to lean forward while standing in one place for a long time, place either foot up on a stationary 2-4 inch high object to help maintain the best posture. When both feet are on the ground, the lower back tends to lose its slight inward curve. If this curve flattens (or becomes too large), then the back and your other joints will experience too much stress, tire more quickly, and can cause pain. CORRECT STANDING POSTURES Proper standing posture  should be assumed with all daily activities, even if they only take a few moments, like when brushing your teeth. As in sitting, your ears should fall over your shoulders and your shoulders should fall over your hips. You should keep a slight tension in your abdominal muscles to brace your spine. Your tailbone should point down to the ground, not behind your body, resulting in an over-extended swayback posture.  INCORRECT STANDING POSTURES  Common incorrect standing postures include a forward head, locked knees and/or an excessive swayback. WALKING Walk with an upright posture. Your ears, shoulders and hips should all line-up. PROLONGED ACTIVITY IN A FLEXED POSITION When completing a task that requires you to bend forward at your waist or lean over a low surface, try to find a way to stabilize 3 out of 4 of your limbs. You can place a hand or elbow on your thigh or rest a knee on the surface you are reaching across. This will provide you more stability, so that your muscles do not tire as quickly. By keeping your knees relaxed, or slightly bent, you will also reduce stress across your lower back. CORRECT LIFTING TECHNIQUES DO :  Assume a wide stance. This will provide you more stability and the opportunity to get as close as possible to the object which you are lifting.  Tense your abdominals to brace your spine. Bend at the knees and hips. Keeping your back locked in a neutral-spine position, lift using your leg muscles. Lift with your legs, keeping your back straight.  Test the weight of unknown objects before attempting to lift them.  Try to keep your elbows locked down at your sides in order get the best strength from your shoulders when carrying an object.  Always ask for help when lifting heavy or awkward objects. INCORRECT LIFTING TECHNIQUES DO NOT:   Lock your knees when lifting, even if it  is a small object.  Bend and twist. Pivot at your feet or move your feet when needing to change  directions.  Assume that you can safely pick up even a paperclip without proper posture. Document Released: 04/27/2005 Document Revised: 07/20/2011 Document Reviewed: 08/09/2008 [facility] Patient Information 2013 [place], [place].

## 2012-03-10 LAB — IRON: Iron: 80 ug/dL (ref 42–145)

## 2012-03-10 LAB — IBC PANEL
%SAT: 26 % (ref 20–55)
UIBC: 233 ug/dL (ref 125–400)

## 2012-03-10 LAB — COMPREHENSIVE METABOLIC PANEL
ALT: 13 U/L (ref 0–35)
AST: 16 U/L (ref 0–37)
Alkaline Phosphatase: 47 U/L (ref 39–117)
BUN: 11 mg/dL (ref 6–23)
Calcium: 9.4 mg/dL (ref 8.4–10.5)
Chloride: 105 mEq/L (ref 96–112)
Creat: 0.74 mg/dL (ref 0.50–1.10)
Glucose, Bld: 90 mg/dL (ref 70–99)
Total Protein: 7.1 g/dL (ref 6.0–8.3)

## 2012-03-10 LAB — TSH: TSH: 1.657 u[IU]/mL (ref 0.350–4.500)

## 2012-03-10 LAB — VITAMIN B12: Vitamin B-12: 432 pg/mL (ref 211–911)

## 2012-03-14 ENCOUNTER — Telehealth: Payer: Self-pay

## 2012-03-14 NOTE — Telephone Encounter (Signed)
PT WOULD LIKE TO SPEAK WITH SOMEONE REGARDING HER VISIT. SHE HAVE QUESTIONS. PLEASE CALL [phone]

## 2012-03-14 NOTE — Telephone Encounter (Signed)
Left message for her to call me back. 

## 2012-03-14 NOTE — Telephone Encounter (Signed)
Pt had question about period and about lubrication and sex.  She had asked what can cause her period to be 3 days or 1 week early.  Advised her per Herbert Seta that her birth control can cause irregular periods up to 6 months and that she can get over the counter lubricants.

## 2012-03-15 LAB — PAP IG AND HPV HIGH-RISK: HPV DNA High Risk: DETECTED — AB

## 2012-03-18 ENCOUNTER — Telehealth: Payer: Self-pay

## 2012-03-18 NOTE — Telephone Encounter (Signed)
PT STATES WE HAD TOLD HER SHE NEEDED TO SEE A OB-GYN AND SHE CAN MAKE AN APPT HERSELF OR WE CAN REFER HER. SHE HAVE MADE AN APPT TO  DR COUSINS [facility] [address] FOR THE [facility], BUT THEY WOULD LIKE HER RECORDS ASAP. PLEASE FAX TO [phone] AND YOU MAY REACH PT AT [phone] IF NEEDED

## 2012-03-18 NOTE — Telephone Encounter (Signed)
Epic records sent and paper chart records faxed with confirmation to Dr. Cherly Hensen.

## 2012-03-24 ENCOUNTER — Telehealth: Payer: Self-pay

## 2012-03-24 NOTE — Telephone Encounter (Signed)
PT STATES SHE WENT TO SEE A SPECIALIST AND WAS TOLD THAT WE MAY HAD MISDIAGNOSED HER. WOULD LIKE TO KNOW WHAT SHE HAD IN 2010 PLEASE CALL [phone] AND CAN LEAVE IT ON HER VOICE MAIL

## 2012-03-24 NOTE — Telephone Encounter (Signed)
Left message for her to call back, chart is on the desk at [facility] counter.

## 2012-03-24 NOTE — Telephone Encounter (Signed)
Spoke to her she states she was confused if she was told she had Herpes or HSV in 2010. 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 She was thankful. I asked her if she needs copies, she states she does not. She is asked to call back if she needs anything else.

## 2012-04-21 ENCOUNTER — Telehealth: Payer: Self-pay

## 2012-04-21 DIAGNOSIS — M545 Low back pain: Secondary | ICD-10-CM

## 2012-04-21 NOTE — Telephone Encounter (Signed)
Pt is wanting a referral to a chiropractor  Best number [phone]

## 2012-04-21 NOTE — Telephone Encounter (Signed)
Pt called a second time wanting a call back in regards to getting a referral  To a  Chiropractor. Please call back [phone]

## 2012-04-22 NOTE — Telephone Encounter (Signed)
Office visit note suggests ortho referral, patient asking for chiropractor referral. She can make her own chiro appt if she wants ortho, will refer. Left message for her to call me back.

## 2012-04-22 NOTE — Telephone Encounter (Signed)
She advised she wants ortho referral, at [facility] ortho, this is made for her. She has been advised we normally do not refer to Chiropractor, if she wants to see one it is her choice and she can call and self schedule this appt, no referral needed.

## 2012-04-25 NOTE — Progress Notes (Signed)
Reviewed and agree.

## 2012-05-11 NOTE — L&D Delivery Note (Signed)
Delivery Note At 7:02 PM a viable and healthy female was delivered via Vaginal, Spontaneous Delivery (Presentation: Left Occiput Anterior).  APGAR: 9, 9; weight P .   Placenta status: Intact, Spontaneous.  Cord: 3 vessels with the following complications: None.    Anesthesia: None  Episiotomy: None Lacerations: None Suture Repair: N/A Est. Blood Loss (mL): 400cc  Mom to postpartum.  Baby to Couplet care / Skin to Skin.  BOVARD,Abbagail Scaff 03/27/2013, 7:31 PM  Br/O+/ RI/   Desires PPBTL, d/w pt r/b/a wish to proceed, scheduled at 1pm 11/18, regular diet until MN, clears until 0900, NPO after 0900.

## 2012-05-30 ENCOUNTER — Telehealth: Payer: Self-pay

## 2012-05-30 NOTE — Telephone Encounter (Signed)
Patient has not used the bathroom in a couple of days after experiencing three days of diarrhea and wants to know if this is normal, or if she should be concerned?  Please call [phone] (H)

## 2012-05-30 NOTE — Telephone Encounter (Signed)
She states she feels fine but was concerned she had not had bowel movement advised if not back to normal in 1-2 days to return to clinic.

## 2012-05-30 NOTE — Telephone Encounter (Signed)
Probably normal, but would like more information. Left message for her to call me back.

## 2012-07-20 ENCOUNTER — Telehealth: Payer: Self-pay

## 2012-07-20 NOTE — Telephone Encounter (Signed)
Looks like we haven't prescribed this since 2012.  I think it is ok to fill, but need to know if pt takes for suppression or for outbreaks only so we get the correct dose.  Pt also needs to discuss with provider at next visit

## 2012-07-20 NOTE — Telephone Encounter (Signed)
Pt states that her pharmacy states that they were unable to get in touch with [place] regarding a refill on her valacyclovir.  ZOXW#[phone] Pharmacy:[facility] [address]

## 2012-07-20 NOTE — Telephone Encounter (Signed)
Please pull paper chart.  

## 2012-07-20 NOTE — Telephone Encounter (Signed)
Can she have refill 

## 2012-07-20 NOTE — Telephone Encounter (Signed)
Chart pulled at nurses station [facility] pool [id number]

## 2012-07-21 ENCOUNTER — Telehealth: Payer: Self-pay | Admitting: Radiology

## 2012-07-21 ENCOUNTER — Other Ambulatory Visit: Payer: Self-pay | Admitting: Radiology

## 2012-07-21 MED ORDER — VALACYCLOVIR HCL 500 MG PO TABS: 500.0000 mg | ORAL_TABLET | Freq: Every day | ORAL | Status: DC

## 2012-07-21 NOTE — Telephone Encounter (Signed)
valcy

## 2012-07-21 NOTE — Telephone Encounter (Signed)
Pt was taking the meds for suppression of herpes. Told her we can send this in . She is to call back with details

## 2012-07-21 NOTE — Telephone Encounter (Signed)
Called her to advise left mesasge.

## 2012-07-25 NOTE — Telephone Encounter (Signed)
Patient advised this is sent in for her,

## 2012-09-16 LAB — OB RESULTS CONSOLE GC/CHLAMYDIA
Chlamydia: NEGATIVE
Gonorrhea: NEGATIVE

## 2012-09-16 LAB — OB RESULTS CONSOLE RPR: RPR: NONREACTIVE

## 2012-09-16 LAB — OB RESULTS CONSOLE HIV ANTIBODY (ROUTINE TESTING): HIV: NONREACTIVE

## 2012-09-16 LAB — OB RESULTS CONSOLE ANTIBODY SCREEN: Antibody Screen: NEGATIVE

## 2012-09-16 LAB — OB RESULTS CONSOLE ABO/RH: RH Type: POSITIVE

## 2013-03-09 LAB — OB RESULTS CONSOLE GBS: GBS: POSITIVE

## 2013-03-21 ENCOUNTER — Telehealth (HOSPITAL_COMMUNITY): Payer: Self-pay | Admitting: *Deleted

## 2013-03-21 NOTE — Telephone Encounter (Signed)
Preadmission screen  

## 2013-03-22 ENCOUNTER — Encounter (HOSPITAL_COMMUNITY): Payer: Self-pay | Admitting: *Deleted

## 2013-03-27 ENCOUNTER — Inpatient Hospital Stay (HOSPITAL_COMMUNITY)
Admission: RE | Admit: 2013-03-27 | Discharge: 2013-03-29 | DRG: 767 | Disposition: A | Discharge status: Home / Self Care | Payer: BC Managed Care – PPO | Source: Ambulatory Visit | Attending: Obstetrics and Gynecology | Admitting: Obstetrics and Gynecology

## 2013-03-27 ENCOUNTER — Encounter (HOSPITAL_COMMUNITY): Payer: Self-pay

## 2013-03-27 DIAGNOSIS — Z302 Encounter for sterilization: Secondary | ICD-10-CM

## 2013-03-27 DIAGNOSIS — O4400 Placenta previa specified as without hemorrhage, unspecified trimester: Secondary | ICD-10-CM | POA: Diagnosis present

## 2013-03-27 DIAGNOSIS — Z22330 Carrier of Group B streptococcus: Secondary | ICD-10-CM

## 2013-03-27 DIAGNOSIS — O365990 Maternal care for other known or suspected poor fetal growth, unspecified trimester, not applicable or unspecified: Principal | ICD-10-CM

## 2013-03-27 DIAGNOSIS — O98519 Other viral diseases complicating pregnancy, unspecified trimester: Secondary | ICD-10-CM | POA: Diagnosis present

## 2013-03-27 DIAGNOSIS — Z3480 Encounter for supervision of other normal pregnancy, unspecified trimester: Secondary | ICD-10-CM

## 2013-03-27 DIAGNOSIS — O99892 Other specified diseases and conditions complicating childbirth: Secondary | ICD-10-CM | POA: Diagnosis present

## 2013-03-27 DIAGNOSIS — A6000 Herpesviral infection of urogenital system, unspecified: Secondary | ICD-10-CM | POA: Diagnosis present

## 2013-03-27 DIAGNOSIS — Z9851 Tubal ligation status: Secondary | ICD-10-CM

## 2013-03-27 HISTORY — DX: Tubal ligation status: Z98.51

## 2013-03-27 HISTORY — DX: Maternal care for other known or suspected poor fetal growth, unspecified trimester, not applicable or unspecified: O36.5990

## 2013-03-27 HISTORY — DX: Encounter for supervision of other normal pregnancy, unspecified trimester: Z34.80

## 2013-03-27 LAB — CBC
MCH: 27.8 pg (ref 26.0–34.0)
MCHC: 32.9 g/dL (ref 30.0–36.0)
MCV: 84.6 fL (ref 78.0–100.0)
Platelets: 206 10*3/uL (ref 150–400)

## 2013-03-27 LAB — RPR: RPR Ser Ql: NONREACTIVE

## 2013-03-27 MED ORDER — IBUPROFEN 600 MG PO TABS: 600.0000 mg | ORAL_TABLET | Freq: Four times a day (QID) | ORAL | Status: DC | PRN

## 2013-03-27 MED ORDER — ACETAMINOPHEN 325 MG PO TABS: 650.0000 mg | ORAL_TABLET | ORAL | Status: DC | PRN

## 2013-03-27 MED ORDER — DIPHENHYDRAMINE HCL 25 MG PO CAPS: 25.0000 mg | ORAL_CAPSULE | Freq: Four times a day (QID) | ORAL | Status: DC | PRN

## 2013-03-27 MED ORDER — LACTATED RINGERS IV SOLN: 500.0000 mL | INTRAVENOUS | Status: DC | PRN

## 2013-03-27 MED ORDER — BENZOCAINE-MENTHOL 20-0.5 % EX AERO: 1.0000 "application " | INHALATION_SPRAY | CUTANEOUS | Status: DC | PRN

## 2013-03-27 MED ORDER — VALACYCLOVIR HCL 500 MG PO TABS
500.0000 mg | ORAL_TABLET | Freq: Every day | ORAL | Status: DC
  Administered 2013-03-27: 500 mg via ORAL
  Filled 2013-03-27 (×2): qty 1

## 2013-03-27 MED ORDER — LACTATED RINGERS IV SOLN
INTRAVENOUS | Status: DC
  Administered 2013-03-28 (×2): via INTRAVENOUS

## 2013-03-27 MED ORDER — OXYTOCIN 40 UNITS IN LACTATED RINGERS INFUSION - SIMPLE MED
62.5000 mL/h | INTRAVENOUS | Status: DC
  Filled 2013-03-27: qty 1000

## 2013-03-27 MED ORDER — ONDANSETRON HCL 4 MG PO TABS: 4.0000 mg | ORAL_TABLET | ORAL | Status: DC | PRN

## 2013-03-27 MED ORDER — DIBUCAINE 1 % RE OINT: 1.0000 "application " | TOPICAL_OINTMENT | RECTAL | Status: DC | PRN

## 2013-03-27 MED ORDER — PENICILLIN G POTASSIUM 5000000 UNITS IJ SOLR
5.0000 10*6.[IU] | Freq: Once | INTRAVENOUS | Status: AC
  Administered 2013-03-27: 5 10*6.[IU] via INTRAVENOUS
  Filled 2013-03-27: qty 5

## 2013-03-27 MED ORDER — SIMETHICONE 80 MG PO CHEW: 80.0000 mg | CHEWABLE_TABLET | ORAL | Status: DC | PRN

## 2013-03-27 MED ORDER — OXYCODONE-ACETAMINOPHEN 5-325 MG PO TABS
1.0000 | ORAL_TABLET | ORAL | Status: DC | PRN
Start: 2013-03-27 — End: 2013-03-27

## 2013-03-27 MED ORDER — OXYTOCIN 40 UNITS IN LACTATED RINGERS INFUSION - SIMPLE MED
1.0000 m[IU]/min | INTRAVENOUS | Status: DC
  Administered 2013-03-27: 4 m[IU]/min via INTRAVENOUS
  Administered 2013-03-27: 2 m[IU]/min via INTRAVENOUS

## 2013-03-27 MED ORDER — ZOLPIDEM TARTRATE 5 MG PO TABS: 5.0000 mg | ORAL_TABLET | Freq: Every evening | ORAL | Status: DC | PRN

## 2013-03-27 MED ORDER — ONDANSETRON HCL 4 MG/2ML IJ SOLN: 4.0000 mg | INTRAMUSCULAR | Status: DC | PRN

## 2013-03-27 MED ORDER — CITRIC ACID-SODIUM CITRATE 334-500 MG/5ML PO SOLN: 30.0000 mL | ORAL | Status: DC | PRN

## 2013-03-27 MED ORDER — LIDOCAINE HCL (PF) 1 % IJ SOLN
30.0000 mL | INTRAMUSCULAR | Status: DC | PRN
  Filled 2013-03-27: qty 30

## 2013-03-27 MED ORDER — ONDANSETRON HCL 4 MG/2ML IJ SOLN: 4.0000 mg | Freq: Four times a day (QID) | INTRAMUSCULAR | Status: DC | PRN

## 2013-03-27 MED ORDER — TERBUTALINE SULFATE 1 MG/ML IJ SOLN: 0.2500 mg | Freq: Once | INTRAMUSCULAR | Status: DC | PRN

## 2013-03-27 MED ORDER — OXYCODONE-ACETAMINOPHEN 5-325 MG PO TABS
1.0000 | ORAL_TABLET | ORAL | Status: DC | PRN
Start: 2013-03-27 — End: 2013-03-29

## 2013-03-27 MED ORDER — OXYTOCIN BOLUS FROM INFUSION: 500.0000 mL | INTRAVENOUS | Status: DC

## 2013-03-27 MED ORDER — WITCH HAZEL-GLYCERIN EX PADS: 1.0000 "application " | MEDICATED_PAD | CUTANEOUS | Status: DC | PRN

## 2013-03-27 MED ORDER — LACTATED RINGERS IV SOLN
INTRAVENOUS | Status: DC
  Administered 2013-03-27 (×2): via INTRAVENOUS

## 2013-03-27 MED ORDER — LANOLIN HYDROUS EX OINT: TOPICAL_OINTMENT | CUTANEOUS | Status: DC | PRN

## 2013-03-27 MED ORDER — PENICILLIN G POTASSIUM 5000000 UNITS IJ SOLR
2.5000 10*6.[IU] | INTRAVENOUS | Status: DC
  Administered 2013-03-27 (×2): 2.5 10*6.[IU] via INTRAVENOUS
  Filled 2013-03-27 (×5): qty 2.5

## 2013-03-27 MED ORDER — IBUPROFEN 600 MG PO TABS
600.0000 mg | ORAL_TABLET | Freq: Four times a day (QID) | ORAL | Status: DC
  Administered 2013-03-28 – 2013-03-29 (×4): 600 mg via ORAL
  Filled 2013-03-27 (×5): qty 1

## 2013-03-27 MED ORDER — PRENATAL MULTIVITAMIN CH: 1.0000 | ORAL_TABLET | Freq: Every day | ORAL | Status: DC

## 2013-03-27 MED ORDER — SENNOSIDES-DOCUSATE SODIUM 8.6-50 MG PO TABS
2.0000 | ORAL_TABLET | ORAL | Status: DC
  Administered 2013-03-29: 2 via ORAL
  Filled 2013-03-27 (×3): qty 2

## 2013-03-27 NOTE — Progress Notes (Signed)
Patient ID: Ashley [name], female   DOB: [birth year], 27 y.o.   MRN: [id number]  Starting to get uncomfortable  AFVSS gen NAD FHTs 130's R, category 1 toco Q 2-7min  SVE 5.5/70/-1-0  AROM w/o difficulty/complication, moderate clear fluid  27yo G2P1001 at 39+ for IOL given IUGR Continue IOL with pitocin Epidural prn Expect SVD

## 2013-03-27 NOTE — Progress Notes (Signed)
Patient ID: Ashley [name], female   DOB: 10-06-1985, 27 y.o.   MRN: [id number]  Pt getting uncomfortable  AFVSS gen NAD FHTs 140's, category 1/2 some variables toco Q 2-4 Pitocin at 4mU  7.8/90/-1 per RN  Anticipate SVD

## 2013-03-27 NOTE — H&P (Signed)
Ashley [name] is a 27 y.o. female G2P1001 at 39+ for IOL given IUGR.  +FM, no LOF, no VB, occ ctx.  Pregnancy complicated by SGA - nl AFI, R BW NST.  Pregnancy dated by [facility] [place] c/w LMP.  Pt also has herpes with rare outreaks - on suppression.    Maternal Medical History:  Contractions: Frequency: irregular.    Fetal activity: Perceived fetal activity is normal.    Prenatal Complications - Diabetes: none.    OB History   Grav Para Term Preterm Abortions TAB SAB Ect Mult Living   2 1 1       1     G1 TSVD 7#9 G2 present No abn pap HSV 2  Past Medical History  Diagnosis Date  . Allergy     Zyrtec daily.  . Genital herpes     HSV II.  . Migraine   . Chlamydia 05/11/2008  . Scoliosis   . Normal pregnancy, repeat 03/27/2013   Past Surgical History  Procedure Laterality Date  . Myringotomy with tube placement     Family History: family history includes Asthma in her mother; Hypertension in her maternal grandmother and mother. Social History:  reports that she has never smoked. She does not have any smokeless tobacco history on file. She reports that she does not drink alcohol or use illicit drugs.married Meds PNV, Valtrex All NKDA    Prenatal Transfer Tool  Maternal Diabetes: No Genetic Screening: Normal Maternal Ultrasounds/Referrals: Normal Fetal Ultrasounds or other Referrals:  None Maternal Substance Abuse:  No Significant Maternal Medications:  None Significant Maternal Lab Results:  Lab values include: Group B Strep positive Other Comments:  had low-lying placenta, growth at 17%, nl dopplers, had dysrythmmia and nl fetal echo  Review of Systems  Constitutional: Negative.   HENT: Negative.   Eyes: Negative.   Respiratory: Negative.   Cardiovascular: Negative.   Gastrointestinal: Negative.   Genitourinary: Negative.   Musculoskeletal: Negative.   Skin: Negative.   Neurological: Negative.   Psychiatric/Behavioral: Negative.       Blood pressure 104/67,  pulse 85, temperature 97.6 F (36.4 C), temperature source Oral, resp. rate 20, height 5\' 6"  (1.676 m), weight 84.641 kg (186 lb 9.6 oz), last menstrual period 06/14/2012. Maternal Exam:  Abdomen: Fundal height is S < D.   Estimated fetal weight is 6-6.5#.   Fetal presentation: vertex  Introitus: Normal vulva. Normal vagina.  Pelvis: adequate for delivery.   Cervix: Cervix evaluated by digital exam.     Physical Exam  Constitutional: She is oriented to person, place, and time. She appears well-developed and well-nourished.  HENT:  Head: Normocephalic and atraumatic.  Cardiovascular: Normal rate and regular rhythm.   Respiratory: Effort normal. No respiratory distress. She has no wheezes.  GI: Soft. Bowel sounds are normal. She exhibits no distension. There is no tenderness.  Musculoskeletal: Normal range of motion.  Neurological: She is alert and oriented to person, place, and time.  Skin: Skin is warm and dry.  Psychiatric: She has a normal mood and affect. Her behavior is normal.    3/50/-3  Prenatal labs: ABO, Rh: O/Positive/-- (05/09 0000) Antibody: Negative (05/09 0000) Rubella: Immune (05/09 0000) RPR: Nonreactive (05/09 0000)  HBsAg: Negative (05/09 0000)  HIV: Non-reactive (05/09 0000)  GBS: Positive (10/30 0000)   Hgb 11.6/ Pap WNL/ Ur Cx neg/ Chl neg/ GC neg/ CF neg/ glucola 99/ Plt 259K  Nl early anat, nl NT, sm fibroid, nl NT cwd female, ant plac, nl  anat, low-lying placenta F/u US placenta resolved, growth at 17% - nl AFI, nl dopplers, nl AFI Fetal dysrrhthmia - nl echo  Assessment/Plan: 27yo G2P1001 at 39+ for IOL given term, elective, favorable cervix, IUGR IOL with pitocin, AROM after PCN PCN for gbbs prophylaxis Expect SVD Epidural or IV pain meds prn  BOVARD,Ashley [name] 03/27/2013, 8:18 AM

## 2013-03-28 ENCOUNTER — Encounter (HOSPITAL_COMMUNITY): Payer: Self-pay

## 2013-03-28 ENCOUNTER — Inpatient Hospital Stay (HOSPITAL_COMMUNITY): Payer: BC Managed Care – PPO | Admitting: Anesthesiology

## 2013-03-28 ENCOUNTER — Encounter (HOSPITAL_COMMUNITY)
Admission: RE | Disposition: A | Discharge status: Home / Self Care | Payer: Self-pay | Source: Ambulatory Visit | Attending: Obstetrics and Gynecology

## 2013-03-28 ENCOUNTER — Encounter (HOSPITAL_COMMUNITY): Payer: BC Managed Care – PPO | Admitting: Anesthesiology

## 2013-03-28 DIAGNOSIS — Z9851 Tubal ligation status: Secondary | ICD-10-CM

## 2013-03-28 HISTORY — PX: TUBAL LIGATION: SHX77

## 2013-03-28 LAB — CBC
Hemoglobin: 10.8 g/dL — ABNORMAL LOW (ref 12.0–15.0)
MCH: 28.1 pg (ref 26.0–34.0)
MCHC: 33.6 g/dL (ref 30.0–36.0)
RDW: 13.2 % (ref 11.5–15.5)
WBC: 21.5 10*3/uL — ABNORMAL HIGH (ref 4.0–10.5)

## 2013-03-28 LAB — SURGICAL PCR SCREEN
MRSA, PCR: NEGATIVE
Staphylococcus aureus: NEGATIVE

## 2013-03-28 SURGERY — LIGATION, FALLOPIAN TUBE, POSTPARTUM
Anesthesia: Spinal | Site: Abdomen | Laterality: Bilateral | Wound class: Clean Contaminated

## 2013-03-28 MED ORDER — PROMETHAZINE HCL 25 MG/ML IJ SOLN: 6.2500 mg | INTRAMUSCULAR | Status: DC | PRN

## 2013-03-28 MED ORDER — KETOROLAC TROMETHAMINE 30 MG/ML IJ SOLN
INTRAMUSCULAR | Status: DC | PRN
  Administered 2013-03-28: 30 mg via INTRAVENOUS

## 2013-03-28 MED ORDER — BUPIVACAINE HCL (PF) 0.25 % IJ SOLN
INTRAMUSCULAR | Status: AC
  Filled 2013-03-28: qty 30

## 2013-03-28 MED ORDER — FENTANYL CITRATE 0.05 MG/ML IJ SOLN: 25.0000 ug | INTRAMUSCULAR | Status: DC | PRN

## 2013-03-28 MED ORDER — EPHEDRINE SULFATE 50 MG/ML IJ SOLN
INTRAMUSCULAR | Status: DC | PRN
  Administered 2013-03-28 (×2): 5 mg via INTRAVENOUS

## 2013-03-28 MED ORDER — FENTANYL CITRATE 0.05 MG/ML IJ SOLN
INTRAMUSCULAR | Status: DC | PRN
  Administered 2013-03-28 (×2): 50 ug via INTRAVENOUS

## 2013-03-28 MED ORDER — KETOROLAC TROMETHAMINE 30 MG/ML IJ SOLN
INTRAMUSCULAR | Status: AC
  Filled 2013-03-28: qty 1

## 2013-03-28 MED ORDER — LIDOCAINE HCL (CARDIAC) 20 MG/ML IV SOLN
INTRAVENOUS | Status: AC
  Filled 2013-03-28: qty 5

## 2013-03-28 MED ORDER — LACTATED RINGERS IV SOLN
INTRAVENOUS | Status: DC
  Administered 2013-03-28: 10 mL/h via INTRAVENOUS

## 2013-03-28 MED ORDER — HYDROMORPHONE HCL PF 1 MG/ML IJ SOLN: 0.2500 mg | INTRAMUSCULAR | Status: DC | PRN

## 2013-03-28 MED ORDER — PROPOFOL 10 MG/ML IV BOLUS
INTRAVENOUS | Status: DC | PRN
  Administered 2013-03-28: 20 mg via INTRAVENOUS

## 2013-03-28 MED ORDER — MIDAZOLAM HCL 5 MG/5ML IJ SOLN
INTRAMUSCULAR | Status: DC | PRN
  Administered 2013-03-28 (×2): 1 mg via INTRAVENOUS

## 2013-03-28 MED ORDER — LIDOCAINE IN DEXTROSE 5-7.5 % IV SOLN
INTRAVENOUS | Status: AC
  Filled 2013-03-28: qty 2

## 2013-03-28 MED ORDER — METOCLOPRAMIDE HCL 10 MG PO TABS
10.0000 mg | ORAL_TABLET | Freq: Once | ORAL | Status: AC
  Administered 2013-03-28: 10 mg via ORAL
  Filled 2013-03-28: qty 1

## 2013-03-28 MED ORDER — OXYCODONE HCL 5 MG/5ML PO SOLN: 5.0000 mg | Freq: Once | ORAL | Status: DC | PRN

## 2013-03-28 MED ORDER — FENTANYL CITRATE 0.05 MG/ML IJ SOLN
INTRAMUSCULAR | Status: AC
  Filled 2013-03-28: qty 5

## 2013-03-28 MED ORDER — BUPIVACAINE HCL (PF) 0.25 % IJ SOLN
INTRAMUSCULAR | Status: DC | PRN
  Administered 2013-03-28: 6 mL

## 2013-03-28 MED ORDER — FAMOTIDINE 20 MG PO TABS
40.0000 mg | ORAL_TABLET | Freq: Once | ORAL | Status: AC
  Administered 2013-03-28: 40 mg via ORAL
  Filled 2013-03-28: qty 2

## 2013-03-28 MED ORDER — MIDAZOLAM HCL 2 MG/2ML IJ SOLN
INTRAMUSCULAR | Status: AC
  Filled 2013-03-28: qty 2

## 2013-03-28 MED ORDER — EPHEDRINE 5 MG/ML INJ
INTRAVENOUS | Status: AC
  Filled 2013-03-28: qty 10

## 2013-03-28 MED ORDER — PROPOFOL 10 MG/ML IV EMUL
INTRAVENOUS | Status: AC
  Filled 2013-03-28: qty 20

## 2013-03-28 MED ORDER — ONDANSETRON HCL 4 MG/2ML IJ SOLN
INTRAMUSCULAR | Status: DC | PRN
  Administered 2013-03-28: 4 mg via INTRAVENOUS

## 2013-03-28 MED ORDER — OXYCODONE HCL 5 MG PO TABS: 5.0000 mg | ORAL_TABLET | Freq: Once | ORAL | Status: DC | PRN

## 2013-03-28 MED ORDER — ONDANSETRON HCL 4 MG/2ML IJ SOLN
INTRAMUSCULAR | Status: AC
  Filled 2013-03-28: qty 2

## 2013-03-28 SURGICAL SUPPLY — 22 items
BLADE SURG 11 STRL SS (BLADE) ×2 IMPLANT
CHLORAPREP W/TINT 26ML (MISCELLANEOUS) ×2 IMPLANT
CLOTH BEACON ORANGE TIMEOUT ST (SAFETY) ×2 IMPLANT
CONTAINER PREFILL 10% NBF 15ML (MISCELLANEOUS) ×4 IMPLANT
DRSG COVADERM PLUS 2X2 (GAUZE/BANDAGES/DRESSINGS) ×2 IMPLANT
ELECT REM PT RETURN 9FT ADLT (ELECTROSURGICAL) ×2
ELECTRODE REM PT RTRN 9FT ADLT (ELECTROSURGICAL) ×1 IMPLANT
GLOVE BIO SURGEON STRL SZ 6.5 (GLOVE) ×2 IMPLANT
GOWN PREVENTION PLUS LG XLONG (DISPOSABLE) ×4 IMPLANT
NEEDLE HYPO 22GX1.5 SAFETY (NEEDLE) ×2 IMPLANT
NS IRRIG 1000ML POUR BTL (IV SOLUTION) ×2 IMPLANT
PACK ABDOMINAL MINOR (CUSTOM PROCEDURE TRAY) ×2 IMPLANT
PENCIL BUTTON HOLSTER BLD 10FT (ELECTRODE) IMPLANT
SPONGE LAP 4X18 X RAY DECT (DISPOSABLE) ×2 IMPLANT
SUT PLAIN 0 NONE (SUTURE) ×2 IMPLANT
SUT VIC AB 0 CT1 27 (SUTURE) ×1
SUT VIC AB 0 CT1 27XBRD ANBCTR (SUTURE) ×1 IMPLANT
SUT VIC AB 3-0 FS2 27 (SUTURE) ×2 IMPLANT
SYR CONTROL 10ML LL (SYRINGE) ×2 IMPLANT
TOWEL OR 17X24 6PK STRL BLUE (TOWEL DISPOSABLE) ×4 IMPLANT
TRAY FOLEY CATH 14FR (SET/KITS/TRAYS/PACK) ×2 IMPLANT
WATER STERILE IRR 1000ML POUR (IV SOLUTION) IMPLANT

## 2013-03-28 NOTE — Lactation Note (Signed)
This note was copied from the chart of Ashley [name]. Lactation Consultation Note  Patient Name: Ashley [name] [id number] Date: 03/28/2013 Reason for consult: Initial assessment Mom is experienced BF and reports baby is nursing well. Mom denies questions or concerns. Encouraged to continue to BF with feeding ques, cluster feedings discussed. Lactation brochure left for review, advised of OP services and support group. Advised to call if would like LC assist with BF.   Maternal Data Formula Feeding for Exclusion: No Infant to breast within first hour of birth: Yes Has patient been taught Hand Expression?: Yes (Mom reports RN demonstrated hand expression to her) Does the patient have breastfeeding experience prior to this delivery?: Yes  Feeding Feeding Type: Breast Fed Length of feed: 45 min  LATCH Score/Interventions Latch: Grasps breast easily, tongue down, lips flanged, rhythmical sucking.  Audible Swallowing: A few with stimulation Intervention(s): Skin to skin;Hand expression  Type of Nipple: Everted at rest and after stimulation  Comfort (Breast/Nipple): Soft / non-tender     Hold (Positioning): No assistance needed to correctly position infant at breast. Intervention(s): Breastfeeding basics reviewed;Support Pillows;Skin to skin  LATCH Score: 9  Lactation Tools Discussed/Used WIC Program: No   Consult Status Consult Status: Follow-up Date: 03/29/13 Follow-up type: In-patient    Alfred Levins 03/28/2013, 5:24 PM

## 2013-03-28 NOTE — Brief Op Note (Signed)
03/27/2013 - 03/28/2013  2:07 PM  PATIENT:  Ashley [name]  27 y.o. female  PRE-OPERATIVE DIAGNOSIS:  Desires Sterilization  POST-OPERATIVE DIAGNOSIS:  Desires Sterilization  PROCEDURE:  Procedure(s): POST PARTUM TUBAL LIGATION (Bilateral) - salpingectomy  SURGEON:  Surgeon(s) and Role:    * Sherron Monday, MD - Primary  ANESTHESIA:   spinal  EBL:  Total I/O In: 1400 [I.V.:1400] Out: 10 [Blood:10]  BLOOD ADMINISTERED:none  DRAINS: none   LOCAL MEDICATIONS USED:  BUPIVICAINE   SPECIMEN:  Source of Specimen:  B tubal segments  DISPOSITION OF SPECIMEN:  PATHOLOGY  COUNTS:  YES  TOURNIQUET:  * No tourniquets in log *  DICTATION: .Other Dictation: Dictation Number [id number]  PLAN OF CARE: return to Mother baby  PATIENT DISPOSITION:  PACU - hemodynamically stable.   Delay start of Pharmacological VTE agent (>24hrs) due to surgical blood loss or risk of bleeding: not applicable

## 2013-03-28 NOTE — Transfer of Care (Signed)
Immediate Anesthesia Transfer of Care Note  Patient: Ashley [name]  Procedure(s) Performed: Procedure(s): POST PARTUM TUBAL LIGATION (Bilateral)  Patient Location: PACU  Anesthesia Type:Spinal  Level of Consciousness: awake, alert  and oriented  Airway & Oxygen Therapy: Patient Spontanous Breathing  Post-op Assessment: Report given to PACU RN  Post vital signs: Reviewed  Complications: No apparent anesthesia complications

## 2013-03-28 NOTE — Progress Notes (Signed)
Post Partum Day 1 Subjective: no complaints, up ad lib, voiding, tolerating PO and nl lochia, pain controlled.  Tubal today at 1 pm.  d/w pt r/b/a  Objective: Blood pressure 107/73, pulse 68, temperature 97.6 F (36.4 C), temperature source Oral, resp. rate 20, height 5\' 6"  (1.676 m), weight 84.641 kg (186 lb 9.6 oz), last menstrual period 06/14/2012, unknown if currently breastfeeding.  Physical Exam:  General: alert and no distress Lochia: appropriate Uterine Fundus: firm   Recent Labs  03/27/13 0833 03/28/13 0550  HGB 11.0* 10.8*  HCT 33.4* 32.1*    Assessment/Plan: Plan for discharge tomorrow, Breastfeeding and Lactation consult.  Doing well.  BTL at 1 pm.     LOS: 1 day   BOVARD,Kriston Mckinnie 03/28/2013, 7:42 AM

## 2013-03-28 NOTE — Anesthesia Preprocedure Evaluation (Addendum)
Anesthesia Evaluation  Patient identified by MRN, date of birth, ID band Patient awake    Reviewed: Allergy & Precautions, NPO status , Patient's Chart, lab work & pertinent test results  Airway Mallampati: II TM Distance: >3 FB Neck ROM: Full    Dental  (+) Teeth Intact and Dental Advidsory Given   Pulmonary    Pulmonary exam normal       Cardiovascular     Neuro/Psych  Headaches,    GI/Hepatic   Endo/Other    Renal/GU      Musculoskeletal   Abdominal Normal abdominal exam  (+)   Peds  Hematology   Anesthesia Other Findings   Reproductive/Obstetrics                          Anesthesia Physical Anesthesia Plan  ASA: II  Anesthesia Plan: Spinal   Post-op Pain Management:    Induction:   Airway Management Planned:   Additional Equipment:   Intra-op Plan:   Post-operative Plan:   Informed Consent: I have reviewed the patients History and Physical, chart, labs and discussed the procedure including the risks, benefits and alternatives for the proposed anesthesia with the patient or authorized representative who has indicated his/her understanding and acceptance.   Dental Advisory Given  Plan Discussed with: Anesthesiologist, CRNA and Surgeon  Anesthesia Plan Comments:        Anesthesia Quick Evaluation

## 2013-03-28 NOTE — Preoperative (Signed)
Beta Blockers   Reason not to administer Beta Blockers:Not Applicable 

## 2013-03-28 NOTE — Anesthesia Postprocedure Evaluation (Signed)
Anesthesia Post Note  Patient: Ashley [name]  Procedure(s) Performed: Procedure(s) (LRB): POST PARTUM TUBAL LIGATION (Bilateral)  Anesthesia type: SAB  Patient location: PACU  Post pain: Pain level controlled  Post assessment: Patient's Cardiovascular Status Stable  Last Vitals:  Filed Vitals:   03/28/13 1415  BP: 92/53  Pulse: 79  Temp:   Resp:     Post vital signs: Reviewed and stable  Level of consciousness: sedated  Complications: No apparent anesthesia complications

## 2013-03-28 NOTE — Anesthesia Procedure Notes (Addendum)
Spinal  Patient location during procedure: OR Preanesthetic Checklist Completed: patient identified, site marked, surgical consent, pre-op evaluation, timeout performed, IV checked, risks and benefits discussed and monitors and equipment checked Spinal Block Patient position: sitting Prep: DuraPrep Patient monitoring: heart rate, cardiac monitor, continuous pulse ox and blood pressure Approach: midline Location: L3-4 Injection technique: single-shot Needle Needle type: Sprotte  Needle gauge: 24 G Needle length: 9 cm Assessment Sensory level: T4 Additional Notes Spinal Dosage in OR  Bupivicaine ml       1.5

## 2013-03-29 ENCOUNTER — Encounter (HOSPITAL_COMMUNITY): Payer: Self-pay | Admitting: Obstetrics and Gynecology

## 2013-03-29 LAB — CCBB MATERNAL DONOR DRAW

## 2013-03-29 MED ORDER — PRENATAL MULTIVITAMIN CH: 1.0000 | ORAL_TABLET | Freq: Every day | ORAL | Status: DC

## 2013-03-29 MED ORDER — IBUPROFEN 800 MG PO TABS: 800.0000 mg | ORAL_TABLET | Freq: Three times a day (TID) | ORAL | Status: DC | PRN

## 2013-03-29 MED ORDER — OXYCODONE-ACETAMINOPHEN 5-325 MG PO TABS: 1.0000 | ORAL_TABLET | Freq: Four times a day (QID) | ORAL | Status: DC | PRN

## 2013-03-29 NOTE — Op Note (Signed)
NAMECAROLIN, Ashley [name]                   ACCOUNT NO.:  [id number]  MEDICAL RECORD NO.:  [id number]  LOCATION:  9145                          FACILITY:  [facility]  PHYSICIAN:  Sherron Monday, MD        DATE OF BIRTH:  April 22, 1986  DATE OF PROCEDURE:  03/28/2013 DATE OF DISCHARGE:                              OPERATIVE REPORT   PREOPERATIVE DIAGNOSIS:  Desires sterilization.  POSTOPERATIVE DIAGNOSIS:  Desires sterilization.  PROCEDURE:  Postpartum bilateral tubal ligation by salpingectomy.  SURGEON:  Sherron Monday, MD  ANESTHESIA:  Spinal.  IV FLUIDS:  1400 mL.  URINE OUTPUT:  Foley catheter was placed prior to the procedure beginning.  ESTIMATED BLOOD LOSS:  Approximately 10 mL.  PATHOLOGY:  Bilateral tubal segments.  COMPLICATIONS:  None.  DESCRIPTION OF PROCEDURE:  After informed consent was reviewed with the patient and her husband, she was taken to the operating room, where a spinal anesthesia was placed and found to be adequate.  She was then returned to the supine position.  A Foley catheter was sterilely placed. Her abdomen was prepped and draped in the normal sterile fashion. Infraumbilical incision was made approximately 15 mm horizontally.  The fascia was entered after the subcu tissue had been cleared off.  The fascia was elevated with Kocher clamps, and entered sharply.  Incision was extended; and using the retractors and a moist tape sponge, her uterus was easily identified and the tube initially was identified on the left, followed out to the fimbriated end.  The fimbriated end and distal portion was removed, doubly ligating with 0 plain gut.  It was noted to be hemostatic.  Attention was then turned to the right which in a similar fashion, the tube was identified, followed out to the fimbriated end, and the distal portion was removed, doubly ligating with a plain gut suture.  Hemostasis was assured.  The fascial incision was closed with 0 Vicryl in a running fashion.  The  subcuticular layer was reapproximated in a single suture of 0 Vicryl.  The skin was closed with 3-0 Vicryl in a subcuticular fashion.  Bandage was applied.  Sponge, lap, and needle counts were correct x2.  The patient tolerated the procedure well.     Sherron Monday, MD     JB/MEDQ  D:  03/28/2013  T:  03/29/2013  Job:  [id number]

## 2013-03-29 NOTE — Progress Notes (Signed)
Post Partum Day 2/ POD # 1 BTl  Subjective: no complaints, up ad lib, voiding, tolerating PO and pain controlled, nl lochia  Objective: Blood pressure 112/75, pulse 65, temperature 98 F (36.7 C), temperature source Oral, resp. rate 16, height 5\' 6"  (1.676 m), weight 84.641 kg (186 lb 9.6 oz), last menstrual period 06/14/2012, SpO2 97.00%, unknown if currently breastfeeding.  Physical Exam:  General: alert and no distress Lochia: appropriate Uterine Fundus: firm Incision: healing well    Recent Labs  03/27/13 0833 03/28/13 0550  HGB 11.0* 10.8*  HCT 33.4* 32.1*    Assessment/Plan: Discharge home, Breastfeeding and Lactation consult.  D/c with motrin, percocet, pnv.  F/u 6 weeks   LOS: 2 days   [name],Ashley Carillo 03/29/2013, 7:50 AM

## 2013-03-29 NOTE — Discharge Summary (Signed)
Obstetric Discharge Summary Reason for Admission: induction of labor Prenatal Procedures: none Intrapartum Procedures: spontaneous vaginal delivery and GBS prophylaxis Postpartum Procedures: P.P. tubal ligation Complications-Operative and Postpartum: none Hemoglobin  Date Value Range Status  03/28/2013 10.8* 12.0 - 15.0 g/dL Final  16/02/9603 54.0  12.2 - 16.2 g/dL Final     HCT  Date Value Range Status  03/28/2013 32.1* 36.0 - 46.0 % Final     HCT, POC  Date Value Range Status  03/09/2012 40.8  37.7 - 47.9 % Final    Physical Exam:  General: alert and no distress Lochia: appropriate Uterine Fundus: firm Incision: healing well DVT Evaluation: No evidence of DVT seen on physical exam.  Discharge Diagnoses: Term Pregnancy-delivered and s/p BTL  Discharge Information: Date: 03/29/2013 Activity: pelvic rest Diet: routine Medications: PNV, Ibuprofen and Percocet Condition: stable Instructions: refer to practice specific booklet Discharge to: home Follow-up Information   Follow up with BOVARD,Dacari Beckstrand, MD. Schedule an appointment as soon as possible for a visit in 2 weeks. (for incision check, 6 weeks for PP visist)    Specialty:  Obstetrics and Gynecology   Contact information:   [address] [place] [address] [phone]       Newborn Data: Live born female  Birth Weight: 6 lb 6.7 oz (2910 g) APGAR: 9, 9  Home with mother.  BOVARD,Santosha Jividen 03/29/2013, 8:14 AM

## 2013-03-29 NOTE — Anesthesia Postprocedure Evaluation (Signed)
  Anesthesia Post-op Note  Patient: Ashley [name]  Procedure(s) Performed: Procedure(s): POST PARTUM TUBAL LIGATION (Bilateral)  Patient Location: Mother/Baby  Anesthesia Type:Spinal  Level of Consciousness: awake, alert  and oriented  Airway and Oxygen Therapy: Patient Spontanous Breathing  Post-op Pain: none  Post-op Assessment: Patient's Cardiovascular Status Stable, Respiratory Function Stable, Patent Airway, No signs of Nausea or vomiting, Adequate PO intake, Pain level controlled, No headache, No backache, No residual numbness and No residual motor weakness  Post-op Vital Signs: Reviewed and stable  Complications: No apparent anesthesia complications

## 2013-03-31 ENCOUNTER — Inpatient Hospital Stay (HOSPITAL_COMMUNITY): Admission: AD | Admit: 2013-03-31 | Payer: Self-pay | Source: Ambulatory Visit | Admitting: Obstetrics and Gynecology

## 2013-08-05 ENCOUNTER — Telehealth: Payer: Self-pay

## 2013-08-05 NOTE — Telephone Encounter (Signed)
Pt is needing to talk with someone about her last diagnosis   Best number [phone]

## 2013-08-05 NOTE — Telephone Encounter (Signed)
lmom to cb. 

## 2013-08-05 NOTE — Telephone Encounter (Signed)
Patient called back to speak to clinical TL. Told her she is assisting one of our providers and that someone will call her back tonight or tomorrow.

## 2013-08-06 NOTE — Telephone Encounter (Signed)
Correction on the phone number from last message   [phone]

## 2013-08-06 NOTE — Telephone Encounter (Signed)
Pt is returning call again.....she has a question concerning her diagnosis. [phone] Thank you

## 2013-08-07 NOTE — Telephone Encounter (Signed)
Lm for rtn call-  Not sure if it went through phone started beeping and disconnected. Tried to call back- no answer

## 2013-08-07 NOTE — Telephone Encounter (Signed)
Advised pt she will need an office visit

## 2013-08-07 NOTE — Telephone Encounter (Signed)
Pt rtn call- was disconnected before I was able to speak with her- We have not seen pt since 02/2012.

## 2013-11-06 ENCOUNTER — Ambulatory Visit (INDEPENDENT_AMBULATORY_CARE_PROVIDER_SITE_OTHER): Payer: BC Managed Care – PPO | Admitting: Physician Assistant

## 2013-11-06 VITALS — BP 110/68 | HR 84 | Temp 98.0°F | Resp 16 | Ht 66.0 in | Wt 187.0 lb

## 2013-11-06 DIAGNOSIS — J020 Streptococcal pharyngitis: Secondary | ICD-10-CM

## 2013-11-06 DIAGNOSIS — J029 Acute pharyngitis, unspecified: Secondary | ICD-10-CM

## 2013-11-06 LAB — POCT RAPID STREP A (OFFICE): Rapid Strep A Screen: POSITIVE — AB

## 2013-11-06 MED ORDER — FLUCONAZOLE 150 MG PO TABS: 150.0000 mg | ORAL_TABLET | Freq: Once | ORAL | Status: DC

## 2013-11-06 MED ORDER — PENICILLIN G BENZATHINE 1200000 UNIT/2ML IM SUSP
1.2000 10*6.[IU] | Freq: Once | INTRAMUSCULAR | Status: AC
  Administered 2013-11-06: 1.2 10*6.[IU] via INTRAMUSCULAR

## 2013-11-06 NOTE — Progress Notes (Signed)
   Subjective:    Patient ID: Ashley [name], female    DOB: November 08, 1985, 28 y.o.   MRN: [id number]  HPI 28 year old female presents for evaluation of sore throat, cough, nasal congestion, and fatigue. States both her husband and daughter have strep and are being treated. She admits she does feel some better than last week when her symptoms started - she initially had fever, chills, and body aches.  Hx of strep infections in the past especially as a child.   Denies nausea, vomiting, abdominal pain, headache, otalgia, sinus pain, or dizziness.         Review of Systems  Constitutional: Negative for fever and chills.  HENT: Positive for congestion and sore throat. Negative for ear pain, sinus pressure and trouble swallowing.   Respiratory: Positive for cough. Negative for chest tightness, shortness of breath and wheezing.   Gastrointestinal: Negative for nausea and vomiting.  Neurological: Negative for dizziness and headaches.       Objective:   Physical Exam  Constitutional: She is oriented to person, place, and time. She appears well-developed and well-nourished.  HENT:  Head: Normocephalic and atraumatic.  Right Ear: Hearing, tympanic membrane, external ear and ear canal normal.  Left Ear: Hearing, tympanic membrane, external ear and ear canal normal.  Mouth/Throat: Uvula is midline. Posterior oropharyngeal erythema (3+ tonsillar swelling) present. No oropharyngeal exudate, posterior oropharyngeal edema or tonsillar abscesses.  Eyes: Conjunctivae are normal.  Neck: Normal range of motion. Neck supple.  Cardiovascular: Normal rate, regular rhythm and normal heart sounds.   Pulmonary/Chest: Effort normal and breath sounds normal.  Lymphadenopathy:    She has no cervical adenopathy.  Neurological: She is alert and oriented to person, place, and time.  Psychiatric: She has a normal mood and affect. Her behavior is normal. Judgment and thought content normal.     Results for orders placed  in visit on 11/06/13  POCT RAPID STREP A (OFFICE)      Result Value Ref Range   Rapid Strep A Screen Positive (*) Negative         Assessment & Plan:  Strep pharyngitis - Plan: penicillin g benzathine (BICILLIN LA) 1200000 UNIT/2ML injection 1.2 Million Units  Acute pharyngitis, unspecified pharyngitis type - Plan: POCT rapid strep A  Bicillin 1.2 million units today.  Motrin or tylenol as needed for pain. Push fluids Change toothbrush Follow up if symptoms worsen or fail to improve.

## 2013-11-06 NOTE — Patient Instructions (Signed)
Strep Throat Strep throat is an infection of the throat caused by a bacteria named Streptococcus pyogenes. Your caregiver may call the infection streptococcal "tonsillitis" or "pharyngitis" depending on whether there are signs of inflammation in the tonsils or back of the throat. Strep throat is most common in children aged 29-15 years during the cold months of the year, but it can occur in people of any age during any season. This infection is spread from person to person (contagious) through coughing, sneezing, or other close contact. SYMPTOMS   Fever or chills.  Painful, swollen, red tonsils or throat.  Pain or difficulty when swallowing.  White or yellow spots on the tonsils or throat.  Swollen, tender lymph nodes or "glands" of the neck or under the jaw.  Red rash all over the body (rare). DIAGNOSIS  Many different infections can cause the same symptoms. A test must be done to confirm the diagnosis so the right treatment can be given. A "rapid strep test" can help your caregiver make the diagnosis in a few minutes. If this test is not available, a light swab of the infected area can be used for a throat culture test. If a throat culture test is done, results are usually available in a day or two. TREATMENT  Strep throat is treated with antibiotic medicine. HOME CARE INSTRUCTIONS   Gargle with 1 tsp of salt in 1 cup of warm water, 3-4 times per day or as needed for comfort.  Family members who also have a sore throat or fever should be tested for strep throat and treated with antibiotics if they have the strep infection.  Make sure everyone in your household washes their hands well.  Do not share food, drinking cups, or personal items that could cause the infection to spread to others.  You may need to eat a soft food diet until your sore throat gets better.  Drink enough water and fluids to keep your urine clear or pale yellow. This will help prevent dehydration.  Get plenty of  rest.  Stay home from school, daycare, or work until you have been on antibiotics for 24 hours.  Only take over-the-counter or prescription medicines for pain, discomfort, or fever as directed by your caregiver.  If antibiotics are prescribed, take them as directed. Finish them even if you start to feel better. SEEK MEDICAL CARE IF:   The glands in your neck continue to enlarge.  You develop a rash, cough, or earache.  You cough up green, yellow-brown, or bloody sputum.  You have pain or discomfort not controlled by medicines.  Your problems seem to be getting worse rather than better. SEEK IMMEDIATE MEDICAL CARE IF:   You develop any new symptoms such as vomiting, severe headache, stiff or painful neck, chest pain, shortness of breath, or trouble swallowing.  You develop severe throat pain, drooling, or changes in your voice.  You develop swelling of the neck, or the skin on the neck becomes red and tender.  You have a fever.  You develop signs of dehydration, such as fatigue, dry mouth, and decreased urination.  You become increasingly sleepy, or you cannot wake up completely. Document Released: 04/24/2000 Document Revised: 04/13/2012 Document Reviewed: 06/26/2010 Exit[employer] Patient Information 2015 [employer]. This information is not intended to replace advice given to you by your health care provider. Make sure you discuss any questions you have with your health care provider.  

## 2013-11-09 ENCOUNTER — Telehealth: Payer: Self-pay

## 2013-11-09 NOTE — Telephone Encounter (Signed)
Patient had an injection on Monday,  She wants to know how long the medication stays in her system.   Please call [phone]

## 2013-11-09 NOTE — Telephone Encounter (Signed)
Advised pt it is unknown how long Bicillin stays in the body. If pt is wondering when to take the diflucan- advised she can wait about 10 -14 days to make sure she catches any yeast infection that may occur.

## 2014-01-06 ENCOUNTER — Telehealth: Payer: Self-pay

## 2014-01-06 NOTE — Telephone Encounter (Signed)
Patient has a yeast infection (states that she gets them frequently) and is requesting Diflucan.   [phone]

## 2014-01-09 NOTE — Telephone Encounter (Signed)
LM to advise pt to RTC- no OV since June.

## 2014-01-16 ENCOUNTER — Ambulatory Visit: Payer: BC Managed Care – PPO

## 2014-03-09 ENCOUNTER — Ambulatory Visit (INDEPENDENT_AMBULATORY_CARE_PROVIDER_SITE_OTHER): Payer: BC Managed Care – PPO | Admitting: Family Medicine

## 2014-03-09 VITALS — BP 110/74 | HR 81 | Temp 98.3°F | Resp 16 | Ht 66.0 in | Wt 184.4 lb

## 2014-03-09 DIAGNOSIS — B3731 Acute candidiasis of vulva and vagina: Secondary | ICD-10-CM

## 2014-03-09 DIAGNOSIS — R1030 Lower abdominal pain, unspecified: Secondary | ICD-10-CM

## 2014-03-09 DIAGNOSIS — H6981 Other specified disorders of Eustachian tube, right ear: Secondary | ICD-10-CM

## 2014-03-09 DIAGNOSIS — B373 Candidiasis of vulva and vagina: Secondary | ICD-10-CM

## 2014-03-09 MED ORDER — FLUCONAZOLE 150 MG PO TABS: 150.0000 mg | ORAL_TABLET | Freq: Once | ORAL | Status: DC

## 2014-03-09 NOTE — Progress Notes (Signed)
Subjective:    Patient ID: Ashley [name], female    DOB: 1985/05/17, 28 y.o.   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 The patient presents today with lower abdominal pain that has lasted for about 2 weeks. The pain is bilateral, aching and intermittent. She has pain at various times throughout the day and sometimes when she lies down at night. Had her period 10/14 and it was a little heavier than usual. It was a couple of days late, so she did a home pregnancy test which was negative. She had a BTL 11/14. She has noticed pain sometimes with intercourse. She has occasional yeast infections but is not currently having any vaginal or urinary symptoms. She would like a little diflucan available at her pharmacy. She sees Dr. Melba Coon regularly, but was unable to get an appointment today. She reports that her blood glucose has been checked and has been normal. She has not taken any medication for abdominal pain.   Has had right ear pain for 4 days. Some runny nose. Has a history of seasonal allergies and has tried an inhaled nasal steroid, but she didn't like using it. She will occasionally take Claritin with good relief, but doesn't like to take daily medication.   Review of Systems No dysuria, hematuria or frequency. No vaginal discharge, itching or burning. No diarrhea or constipation. No fever or chills.     Objective:   Physical Exam  Vitals reviewed. Constitutional: She is oriented to person, place, and time. She appears well-developed and well-nourished.  HENT:  Head: Normocephalic and atraumatic.  Right Ear: External ear and ear canal normal. Tympanic membrane is retracted.  Left Ear: Tympanic membrane, external ear and ear canal normal.  Nose: Mucosal edema and rhinorrhea present. Right sinus exhibits no maxillary sinus tenderness and no frontal sinus tenderness. Left sinus exhibits no maxillary sinus tenderness and no frontal sinus  tenderness.  Mouth/Throat: Uvula is midline, oropharynx is clear and moist and mucous membranes are normal.  Eyes: Conjunctivae are normal.  Neck: Normal range of motion. Neck supple.  Cardiovascular: Normal rate, regular rhythm and normal heart sounds.   Pulmonary/Chest: Effort normal and breath sounds normal.  Abdominal: Soft. Bowel sounds are normal. She exhibits no distension and no mass. Tenderness: LLQ, RLQ with deep palpation. Left > right.  There is no rebound and no guarding.  Genitourinary: Vagina normal and uterus normal. Pelvic exam was performed with patient supine. Cervix exhibits no motion tenderness. Right adnexum displays tenderness. Right adnexum displays no fullness. Left adnexum displays tenderness. Left adnexum displays no mass and no fullness.  Musculoskeletal: Normal range of motion.  Neurological: She is alert and oriented to person, place, and time.  Skin: Skin is warm and dry.  Psychiatric: She has a normal mood and affect. Her behavior is normal. Judgment and thought content normal.      Assessment & Plan:  1. Lower abdominal pain - Ambulatory referral to Gynecology- the patient has been in touch with Dr. Roe Rutherford office and we will send over the office note and try to facilitate Pelvic US. -Patient to try OTC analgesics prn pain, OTC lubrication as needed during intercourse.  2. Vaginal yeast infection - fluconazole (DIFLUCAN) 150 MG tablet; Take 1 tablet (150 mg total) by mouth once. Repeat if needed  Dispense: 2 tablet; Refill: 3  3. Eustachian tube dysfunction, right -Provided written and verbal information regarding diagnosis and treatment. -OTC  decongestants, Afrin nasal spray and antihistamines.   Elby Beck, FNP-BC  [facility]  03/09/2014 3:03 PM

## 2014-03-09 NOTE — Patient Instructions (Signed)
Afrin for up to 4 days Sudafed (generic fine) twice a day for congestion/ear pressure  Barotitis Media Barotitis media is inflammation of your middle ear. This occurs when the auditory tube (eustachian tube) leading from the back of your nose (nasopharynx) to your eardrum is blocked. This blockage may result from a cold, environmental allergies, or an upper respiratory infection. Unresolved barotitis media may lead to damage or hearing loss (barotrauma), which may become permanent. HOME CARE INSTRUCTIONS   Use medicines as recommended by your health care provider. Over-the-counter medicines will help unblock the canal and can help during times of air travel.  Do not put anything into your ears to clean or unplug them. Eardrops will not be helpful.  Do not swim, dive, or fly until your health care provider says it is all right to do so. If these activities are necessary, chewing gum with frequent, forceful swallowing may help. It is also helpful to hold your nose and gently blow to pop your ears for equalizing pressure changes. This forces air into the eustachian tube.  Only take over-the-counter or prescription medicines for pain, discomfort, or fever as directed by your health care provider.  A decongestant may be helpful in decongesting the middle ear and make pressure equalization easier. SEEK MEDICAL CARE IF:  You experience a serious form of dizziness in which you feel as if the room is spinning and you feel nauseated (vertigo).  Your symptoms only involve one ear. SEEK IMMEDIATE MEDICAL CARE IF:   You develop a severe headache, dizziness, or severe ear pain.  You have bloody or pus-like drainage from your ears.  You develop a fever.  Your problems do not improve or become worse. MAKE SURE YOU:   Understand these instructions.  Will watch your condition.  Will get help right away if you are not doing well or get worse. Document Released: 04/24/2000 Document Revised:  02/15/2013 Document Reviewed: 11/22/2012 [facility] Patient Information 2015 [place], [place]. This information is not intended to replace advice given to you by your health care provider. Make sure you discuss any questions you have with your health care provider.

## 2014-03-16 ENCOUNTER — Telehealth: Payer: Self-pay

## 2014-03-16 NOTE — Telephone Encounter (Signed)
Pt was seen 03/09/14 by debbie gessner for sinus trouble. The pt said the symptoms are persisting and she wants to know what she can do to recover.

## 2014-03-19 NOTE — Telephone Encounter (Signed)
LM for rtn call- If pt has tried Affrin for 4 days along with Sudafed OTC and has not found relief then pt should RTC

## 2014-10-12 ENCOUNTER — Ambulatory Visit: Payer: BC Managed Care – PPO

## 2014-10-24 ENCOUNTER — Other Ambulatory Visit: Payer: Self-pay | Admitting: Family Medicine

## 2014-11-20 ENCOUNTER — Ambulatory Visit (INDEPENDENT_AMBULATORY_CARE_PROVIDER_SITE_OTHER): Payer: BC Managed Care – PPO | Admitting: Physician Assistant

## 2014-11-20 VITALS — BP 118/76 | HR 81 | Temp 98.3°F | Resp 18 | Ht 66.0 in | Wt 181.4 lb

## 2014-11-20 DIAGNOSIS — R350 Frequency of micturition: Secondary | ICD-10-CM

## 2014-11-20 DIAGNOSIS — B373 Candidiasis of vulva and vagina: Secondary | ICD-10-CM

## 2014-11-20 DIAGNOSIS — N3000 Acute cystitis without hematuria: Secondary | ICD-10-CM | POA: Diagnosis not present

## 2014-11-20 DIAGNOSIS — N898 Other specified noninflammatory disorders of vagina: Secondary | ICD-10-CM | POA: Diagnosis not present

## 2014-11-20 DIAGNOSIS — A6000 Herpesviral infection of urogenital system, unspecified: Secondary | ICD-10-CM | POA: Diagnosis not present

## 2014-11-20 DIAGNOSIS — B3731 Acute candidiasis of vulva and vagina: Secondary | ICD-10-CM

## 2014-11-20 LAB — POCT URINALYSIS DIPSTICK
BILIRUBIN UA: NEGATIVE
Glucose, UA: NEGATIVE
KETONES UA: NEGATIVE
Nitrite, UA: NEGATIVE
Protein, UA: 100
Spec Grav, UA: 1.02
Urobilinogen, UA: 0.2
pH, UA: 8

## 2014-11-20 LAB — POCT WET PREP WITH KOH
Clue Cells Wet Prep HPF POC: NEGATIVE
KOH Prep POC: POSITIVE
TRICHOMONAS UA: NEGATIVE
YEAST WET PREP PER HPF POC: POSITIVE

## 2014-11-20 LAB — POCT UA - MICROSCOPIC ONLY
CASTS, UR, LPF, POC: NEGATIVE
CRYSTALS, UR, HPF, POC: NEGATIVE
MUCUS UA: NEGATIVE
YEAST UA: NEGATIVE

## 2014-11-20 MED ORDER — VALACYCLOVIR HCL 500 MG PO TABS: 500.0000 mg | ORAL_TABLET | Freq: Two times a day (BID) | ORAL | Status: DC

## 2014-11-20 MED ORDER — CIPROFLOXACIN HCL 500 MG PO TABS: 500.0000 mg | ORAL_TABLET | Freq: Two times a day (BID) | ORAL | Status: DC

## 2014-11-20 MED ORDER — FLUCONAZOLE 150 MG PO TABS: 150.0000 mg | ORAL_TABLET | Freq: Once | ORAL | Status: DC

## 2014-11-20 NOTE — Progress Notes (Signed)
Subjective:    Patient ID: Ashley [name], female    DOB: [birth year], 29 y.o.   MRN: [id number]  HPI Patient present for urinary frequency and vaginal discharge.  Urinary frequency started 2 days ago and improved with Azo, but has come back. Additionally endorses lower abdominal pain, decreased vol, and urgency. Additionally has white vaginal discharge that itches that has been present prior to urinary sx. Denies vaginal pain or bleeding. LMP 11/10/14 was normal. Sexually active with husband only. NKDA.  Request refill of valtrex for genital herpes only takes medication with outbreaks.   Review of Systems  Constitutional: Negative for fever and chills.  Gastrointestinal: Positive for abdominal pain. Negative for nausea and vomiting.  Genitourinary: Positive for dysuria, urgency, decreased urine volume and vaginal discharge. Negative for frequency, hematuria, flank pain, vaginal bleeding, genital sores, vaginal pain and menstrual problem.  Musculoskeletal: Negative for back pain.      Objective:   Physical Exam  Constitutional: She is oriented to person, place, and time. She appears well-developed and well-nourished. No distress.  Blood pressure 118/76, pulse 81, temperature 98.3 F (36.8 C), temperature source Oral, resp. rate 18, height 5\' 6"  (1.676 m), weight 181 lb 6.4 oz (82.283 kg), last menstrual period 11/10/2014, SpO2 98 %  HENT:  Head: Normocephalic and atraumatic.  Right Ear: External ear normal.  Left Ear: External ear normal.  Eyes: Conjunctivae are normal. Right eye exhibits no discharge. Left eye exhibits no discharge. No scleral icterus.  Cardiovascular: Normal rate, regular rhythm and normal heart sounds.  Exam reveals no gallop and no friction rub.   No murmur heard. Pulmonary/Chest: Effort normal and breath sounds normal. No respiratory distress. She has no wheezes. She has no rales.  Abdominal: Soft. Bowel sounds are normal. She exhibits no distension. There is no  tenderness. There is no rebound, no guarding and no CVA tenderness. No hernia.  Neurological: She is alert and oriented to person, place, and time.  Skin: Skin is warm and dry. No rash noted. She is not diaphoretic. No erythema. No pallor.  Psychiatric: She has a normal mood and affect. Her behavior is normal. Judgment and thought content normal.   Results for orders placed or performed in visit on 11/20/14  POCT UA - Microscopic Only  Result Value Ref Range   WBC, Ur, HPF, POC TNTC    RBC, urine, microscopic 6-8    Bacteria, U Microscopic 3+    Mucus, UA neg    Epithelial cells, urine per micros 2-4    Crystals, Ur, HPF, POC neg    Casts, Ur, LPF, POC neg    Yeast, UA neg   POCT urinalysis dipstick  Result Value Ref Range   Color, UA yellow    Clarity, UA cloudy    Glucose, UA neg    Bilirubin, UA neg    Ketones, UA neg    Spec Grav, UA 1.020    Blood, UA small    pH, UA 8.0    Protein, UA 100    Urobilinogen, UA 0.2    Nitrite, UA neg    Leukocytes, UA large (3+) (A) Negative  POCT Wet Prep with KOH  Result Value Ref Range   Trichomonas, UA Negative    Clue Cells Wet Prep HPF POC neg    Epithelial Wet Prep HPF POC Many Few, Moderate, Many   Yeast Wet Prep HPF POC positive    Bacteria Wet Prep HPF POC Moderate (A) Few   RBC Wet Prep  HPF POC 0-1    WBC Wet Prep HPF POC 4-8    KOH Prep POC Positive        Assessment & Plan:  1. Urinary frequency - POCT UA - Microscopic Only - POCT urinalysis dipstick  2. Vaginal discharge - POCT Wet Prep with KOH  3. Acute cystitis without hematuria - ciprofloxacin (CIPRO) 500 MG tablet; Take 1 tablet (500 mg total) by mouth 2 (two) times daily.  Dispense: 20 tablet; Refill: 0  4. Yeast vaginitis - fluconazole (DIFLUCAN) 150 MG tablet; Take 1 tablet (150 mg total) by mouth once. Repeat if needed  Dispense: 2 tablet; Refill: 0  5. Genital herpes - valACYclovir (VALTREX) 500 MG tablet; Take 1 tablet (500 mg total) by mouth 2  (two) times daily. Take for 3 days with outbreaks.  Dispense: 30 tablet; Refill: [place] PA-C  [facility] 11/20/2014 4:01 PM

## 2014-11-20 NOTE — Patient Instructions (Signed)

## 2014-11-27 NOTE — Progress Notes (Signed)
  Medical screening examination/treatment/procedure(s) were performed by non-physician practitioner and as supervising physician I was immediately available for consultation/collaboration.     

## 2014-11-27 NOTE — Addendum Note (Signed)
Addended by: Roselee Culver on: 11/27/2014 05:39 PM   Modules accepted: Miquel Dunn

## 2015-01-28 ENCOUNTER — Ambulatory Visit (INDEPENDENT_AMBULATORY_CARE_PROVIDER_SITE_OTHER): Payer: BC Managed Care – PPO | Admitting: Family Medicine

## 2015-01-28 ENCOUNTER — Ambulatory Visit (INDEPENDENT_AMBULATORY_CARE_PROVIDER_SITE_OTHER): Payer: BC Managed Care – PPO

## 2015-01-28 DIAGNOSIS — M25551 Pain in right hip: Secondary | ICD-10-CM

## 2015-01-28 LAB — POCT URINE PREGNANCY: Preg Test, Ur: NEGATIVE
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
## 2015-01-28 NOTE — Patient Instructions (Signed)

## 2015-01-31 ENCOUNTER — Telehealth: Payer: Self-pay

## 2015-01-31 NOTE — Telephone Encounter (Signed)
Pt is needing a work note for tomorrow   Please call [phone]

## 2015-01-31 NOTE — Telephone Encounter (Signed)
Left message for pt to call back  °

## 2015-01-31 NOTE — Telephone Encounter (Signed)
Spoke with pt, she states her back hurts and she is miserable at work and would like tomorrow off to have the weekend to rest.

## 2015-02-04 ENCOUNTER — Ambulatory Visit (INDEPENDENT_AMBULATORY_CARE_PROVIDER_SITE_OTHER): Payer: BC Managed Care – PPO | Admitting: Emergency Medicine

## 2015-02-04 ENCOUNTER — Ambulatory Visit (INDEPENDENT_AMBULATORY_CARE_PROVIDER_SITE_OTHER): Payer: BC Managed Care – PPO

## 2015-02-04 VITALS — BP 128/70 | HR 74 | Temp 98.0°F | Resp 18 | Ht 66.0 in | Wt 181.0 lb

## 2015-02-04 DIAGNOSIS — S161XXA Strain of muscle, fascia and tendon at neck level, initial encounter: Secondary | ICD-10-CM

## 2015-02-04 DIAGNOSIS — W57XXXA Bitten or stung by nonvenomous insect and other nonvenomous arthropods, initial encounter: Secondary | ICD-10-CM

## 2015-02-04 MED ORDER — PERMETHRIN 5 % EX CREA
1.0000 | TOPICAL_CREAM | Freq: Once | CUTANEOUS | Status: DC
Start: 2015-02-04 — End: 2015-02-28

## 2015-02-04 NOTE — Progress Notes (Addendum)
Patient ID: Ashley [name], female   DOB: 1985/08/13, 29 y.o.   MRN: [id number]    This chart was scribed for Nena Jordan, MD by [facility], medical scribe at [facility].The patient was seen in exam room 03 and the patient's care was started at 8:55 AM.  Chief Complaint:  Chief Complaint  Patient presents with   Insect Bite    bed bugs, noticed friday, itching    HPI: Ashley [name] is a 29 y.o. female who reports to [facility] today complaining of insect bites. Onset this [place] a resort. These bites are present on the upper and lower extremities. As well as the back and buttocks. The bites are itchy. Her husband has similar bites. No chance of pregnancy. Had a tubal ligation.   Past Medical History  Diagnosis Date   Allergy     Zyrtec daily.   Genital herpes     HSV II.   Migraine    Chlamydia 05/11/2008   Scoliosis    Normal pregnancy, repeat 03/27/2013   SGA (small for gestational age), fetal, affecting care of mother, antepartum 03/27/2013   SVD (spontaneous vaginal delivery) 03/27/2013   S/P tubal ligation 03/28/2013   Past Surgical History  Procedure Laterality Date   Myringotomy with tube placement     Tubal ligation Bilateral 03/28/2013    Procedure: POST PARTUM TUBAL LIGATION;  Surgeon: Thornell Sartorius, MD;  Location: [facility];  Service: Gynecology;  Laterality: Bilateral;   Social History   Social History   Marital Status: Married    Spouse Name: N/A   Number of Children: N/A   Years of Education: N/A   Social History Main Topics   Smoking status: Never Smoker    Smokeless tobacco: Never Used   Alcohol Use: No   Drug Use: No   Sexual Activity: Yes    Birth Control/ Protection: Pill   Other Topics Concern   None   Social History Narrative   Marital status: married x 2 years; happily married; no abuse.       Children: 1 daughter (62 months)      Lives: with husband, daughter.     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     Tobacco: none      Alcohol: none      Drugs: none      Exercise:         Family History  Problem Relation Age of Onset   Hypertension Mother    Asthma Mother    Hypertension Maternal Grandmother    Hypertension Paternal Grandfather    Heart disease Paternal Grandfather    No Known Allergies Prior to Admission medications   Medication Sig Start Date End Date Taking? Authorizing Provider  valACYclovir (VALTREX) 500 MG tablet Take 1 tablet (500 mg total) by mouth 2 (two) times daily. Take for 3 days with outbreaks. 11/20/14  Yes Tishira R Brewington, PA-C    ROS: The patient denies fevers, chills, night sweats, unintentional weight loss, chest pain, palpitations, wheezing, dyspnea on exertion, nausea, vomiting, abdominal pain, dysuria, hematuria, melena, numbness, weakness, or tingling.  All other systems have been reviewed and were otherwise negative with the exception of those mentioned in the HPI and as above.    PHYSICAL EXAM: Filed Vitals:   02/04/15 0847  BP: 128/70  Pulse: 74  Temp: 98 F (36.7 C)  Resp: 18   Body mass index is 29.23 kg/(m^2).  General: Alert, no acute distress HEENT:  Normocephalic, atraumatic, oropharynx patent. Eye: Juliette Mangle [facility] Cardiovascular:  Regular rate and rhythm, no rubs murmurs or gallops.  No Carotid bruits, radial pulse intact. No pedal edema.  Respiratory: Clear to auscultation bilaterally.  No wheezes, rales, or rhonchi.  No cyanosis, no use of accessory musculature Abdominal: No organomegaly, abdomen is soft and non-tender, positive bowel sounds.  No masses. Musculoskeletal: Gait intact. No edema. Tenderness upper C-spine and right trapezius. Skin: Multiple 2 -3 mm firm raised macular, papular areas involving hand shoulders and upper extremites Neurologic: Facial musculature symmetric. Psychiatric: Patient acts appropriately throughout our interaction. Lymphatic: No cervical or submandibular  lymphadenopathy Genitourinary/Anorectal: No acute findings Meds ordered this encounter  Medications   permethrin (ELIMITE) 5 % cream    Sig: Apply 1 application topically once. Apply medication at bedtime tonight leave overnight and wash off in the morning    Dispense:  120 g    Refill:  0    LABS: Results for orders placed or performed in visit on 01/28/15  POCT urine pregnancy  Result Value Ref Range   Preg Test, Ur Negative Negative    EKG/XRAY:   Primary read interpreted by Dr. Everlene Farrier at [facility]. Two-view C-spine no fracture seen  ASSESSMENT/PLAN: We'll treat with Elimite for her bites. She will apply heat and ice for her neck discomfort Gross sideeffects, risk and benefits, and alternatives of medications d/w patient. Patient is aware that all medications have potential sideeffects and we are unable to predict every sideeffect or drug-drug interaction that may occur.  By signing my name below, I, Nadim Abuhashem, attest that this documentation has been prepared under the direction and in the presence of Nena Jordan, MD.  Electronically Signed: Lora Havens, medical scribe. 02/04/2015, 8:55 AM.  Arlyss Queen MD 02/04/2015 8:55 AM

## 2015-02-04 NOTE — Patient Instructions (Signed)
Bedbugs  Bedbugs are tiny bugs that live in and around beds. During the day, they hide in mattresses and other places near beds. They come out at night and bite people lying in bed. They need blood to live and grow. Bedbugs can be found in beds anywhere. Usually, they are found in places where many people come and go (hotels, shelters, hospitals). It does not matter whether the place is dirty or clean.  Getting bitten by bedbugs rarely causes a medical problem. The biggest problem can be getting rid of them.  This often takes the work of a pest control expert.  CAUSES  · Less use of pesticides. Bedbugs were common before the 1950s. Then, strong pesticides such as DDT nearly wiped them out. Today, these pesticides are not used because they harm the environment and can cause health problems.  · More travel. Besides mattresses, bedbugs can also live in clothing and luggage. They can come along as people travel from place to place. Bedbugs are more common in certain parts of the world. When people travel to those areas, the bugs can come home with them.  · Presence of birds and bats. Bedbugs often infest birds and bats. If you have these animals in or near your home, bedbugs may infest your house, too.  SYMPTOMS  It does not hurt to be bitten by a bedbug. You will probably not wake up when you are bitten. Bedbugs usually bite areas of the skin that are not covered. Symptoms may show when you wake up, or they may take a day or more to show up. Symptoms may include:  · Small red bumps on the skin. These might be lined up in a row or clustered in a group.  · A darker red dot in the middle of red bumps.  · Blisters on the skin. There may be swelling and very bad itching. These may be signs of an allergic reaction. This does not happen often.  DIAGNOSIS  Bedbug bites might look and feel like other types of insect bites. The bugs do not stay on the body like ticks or lice. They bite, drop off, and crawl away to hide. Your  caregiver will probably:  · Ask about your symptoms.  · Ask about your recent activities and travel.  · Check your skin for bedbug bites.  · Ask you to check at home for signs of bedbugs. You should look for:  ¨ Spots or stains on the bed or nearby. This could be from bedbugs that were crushed or from their eggs or waste.  ¨ Bedbugs themselves. They are reddish-brown, oval, and flat. They do not fly. They are about the size of an apple seed.  · Places to look for bedbugs include:  ¨ Beds. Check mattresses, headboards, box springs, and bed frames.  ¨ On drapes and curtains near the bed.  ¨ Under carpeting in the bedroom.  ¨ Behind electrical outlets.  ¨ Behind any wallpaper that is peeling.  ¨ Inside luggage.  TREATMENT  Most bedbug bites do not need treatment. They usually go away on their own in a few days. The bites are not dangerous. However, treatment may be needed if you have scratched so much that your skin has become infected. You may also need treatment if you are allergic to bedbug bites. Treatment options include:  · A drug that stops swelling and itching (corticosteroid). Usually, a cream is rubbed on the skin. If you have a bad rash, you may be   Take any medicine prescribed by your caregiver for your bites. Follow the directions carefully.  Consider wearing pajamas with long sleeves and pant legs.  Your bedroom may need to be treated. A pest control expert should make sure the bedbugs are gone. You may need to throw away mattresses or luggage. Ask the pest control expert what you can do to keep the bedbugs from coming back. Common suggestions include:  Putting a plastic cover over your mattress.  Washing and drying your clothes and bedding in hot water and a hot dryer. The temperature should be hotter  than 120 F (48.9 C). Bedbugs are killed by high temperatures.  Vacuuming carefully all around your bed. Vacuum in all cracks and crevices where the bugs might hide. Do this often.  Carefully checking all used furniture, bedding, or clothes that you bring into your house.  Eliminating bird nests and bat roosts.  If you get bedbug bites when traveling, check all your possessions carefully before bringing them into your house. If you find any bugs on clothes or in your luggage, consider throwing those items away. SEEK MEDICAL CARE IF:  You have red bug bites that keep coming back.  You have red bug bites that itch badly.  You have bug bites that cause a skin rash.  You have scratch marks that are red and sore. SEEK IMMEDIATE MEDICAL CARE IF: You have a fever. Document Released: 05/30/2010 Document Revised: 07/20/2011 Document Reviewed: 05/30/2010 [facility] Patient Information 2015 [place], [place]. This information is not intended to replace advice given to you by your health care provider. Make sure you discuss any questions you have with your health care provider. Scabies Scabies are small bugs (mites) that burrow under the skin and cause red bumps and severe itching. These bugs can only be seen with a microscope. Scabies are highly contagious. They can spread easily from person to person by direct contact. They are also spread through sharing clothing or linens that have the scabies mites living in them. It is not unusual for an entire family to become infected through shared towels, clothing, or bedding.  HOME CARE INSTRUCTIONS   Your caregiver may prescribe a cream or lotion to kill the mites. If cream is prescribed, massage the cream into the entire body from the neck to the bottom of both feet. Also massage the cream into the scalp and face if your child is less than 78 year old. Avoid the eyes and mouth. Do not wash your hands after application.  Leave the cream on for 8 to 12 hours.  Your child should bathe or shower after the 8 to 12 hour application period. Sometimes it is helpful to apply the cream to your child right before bedtime.  One treatment is usually effective and will eliminate approximately 95% of infestations. For severe cases, your caregiver may decide to repeat the treatment in 1 week. Everyone in your household should be treated with one application of the cream.  New rashes or burrows should not appear within 24 to 48 hours after successful treatment. However, the itching and rash may last for 2 to 4 weeks after successful treatment. Your caregiver may prescribe a medicine to help with the itching or to help the rash go away more quickly.  Scabies can live on clothing or linens for up to 3 days. All of your child's recently used clothing, towels, stuffed toys, and bed linens should be washed in hot water and then dried in a dryer for at least  20 minutes on high heat. Items that cannot be washed should be enclosed in a plastic bag for at least 3 days.  To help relieve itching, bathe your child in a cool bath or apply cool washcloths to the affected areas.  Your child may return to school after treatment with the prescribed cream. SEEK MEDICAL CARE IF:   The itching persists longer than 4 weeks after treatment.  The rash spreads or becomes infected. Signs of infection include red blisters or yellow-tan crust. Document Released: 04/27/2005 Document Revised: 07/20/2011 Document Reviewed: 09/05/2008 [facility] Patient Information 2015 [place], [place]. This information is not intended to replace advice given to you by your health care provider. Make sure you discuss any questions you have with your health care provider.

## 2015-02-04 NOTE — Progress Notes (Signed)
Patient ID: Ashley [name], female   DOB: 1985-09-17, 29 y.o.   MRN: [id number]    This chart was scribed for Nena Jordan, MD by [facility], medical scribe at [facility] [place].The patient was seen in exam room 03 and the patient's care was started at 8:55 AM.  Chief Complaint:  Chief Complaint  Patient presents with  . Insect Bite    bed bugs, noticed friday, itching    HPI: Ashley [name] is a 29 y.o. female who reports to [facility] today complaining of insect bites. Onset this past weekend while [place] a resort. These bites are present on the upper and lower extremities. As well as the back and buttocks. The bites are itchy. Her husband has similar bites. No chance of pregnancy. Had a tubal ligation.   Past Medical History  Diagnosis Date  . Allergy     Zyrtec daily.  . Genital herpes     HSV II.  . Migraine   . Chlamydia 05/11/2008  . Scoliosis   . Normal pregnancy, repeat 03/27/2013  . SGA (small for gestational age), fetal, affecting care of mother, antepartum 03/27/2013  . SVD (spontaneous vaginal delivery) 03/27/2013  . S/P tubal ligation 03/28/2013   Past Surgical History  Procedure Laterality Date  . Myringotomy with tube placement    . Tubal ligation Bilateral 03/28/2013    Procedure: POST PARTUM TUBAL LIGATION;  Surgeon: Thornell Sartorius, MD;  Location: [facility];  Service: Gynecology;  Laterality: Bilateral;   Social History   Social History  . Marital Status: Married    Spouse Name: N/A  . Number of Children: N/A  . Years of Education: N/A   Social History Main Topics  . Smoking status: Never Smoker   . Smokeless tobacco: Never Used  . Alcohol Use: No  . Drug Use: No  . Sexual Activity: Yes    Birth Control/ Protection: Pill   Other Topics Concern  . None   Social History Narrative   Marital status: married x 2 years; happily married; no abuse.       Children: 1 daughter (70 months)      Lives: with husband, daughter.     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     Tobacco: none      Alcohol: none      Drugs: none      Exercise:         Family History  Problem Relation Age of Onset  . Hypertension Mother   . Asthma Mother   . Hypertension Maternal Grandmother   . Hypertension Paternal Grandfather   . Heart disease Paternal Grandfather    No Known Allergies Prior to Admission medications   Medication Sig Start Date End Date Taking? Authorizing Provider  valACYclovir (VALTREX) 500 MG tablet Take 1 tablet (500 mg total) by mouth 2 (two) times daily. Take for 3 days with outbreaks. 11/20/14  Yes Tishira R Brewington, PA-C    ROS: The patient denies fevers, chills, night sweats, unintentional weight loss, chest pain, palpitations, wheezing, dyspnea on exertion, nausea, vomiting, abdominal pain, dysuria, hematuria, melena, numbness, weakness, or tingling.  All other systems have been reviewed and were otherwise negative with the exception of those mentioned in the HPI and as above.    PHYSICAL EXAM: Filed Vitals:   02/04/15 0847  BP: 128/70  Pulse: 74  Temp: 98 F (36.7 C)  Resp: 18   Body mass index is 29.23 kg/(m^2).  General: Alert, no acute distress HEENT:  Normocephalic, atraumatic, oropharynx patent. Eye: Juliette Mangle [facility] Cardiovascular:  Regular rate and rhythm, no rubs murmurs or gallops.  No Carotid bruits, radial pulse intact. No pedal edema.  Respiratory: Clear to auscultation bilaterally.  No wheezes, rales, or rhonchi.  No cyanosis, no use of accessory musculature Abdominal: No organomegaly, abdomen is soft and non-tender, positive bowel sounds.  No masses. Musculoskeletal: Gait intact. No edema. Tenderness upper C-spine and right trapezius. Skin: Multiple 2 -3 mm firm raised macular, papular areas involving hand shoulders and upper extremites Neurologic: Facial musculature symmetric. Psychiatric: Patient acts appropriately throughout our interaction. Lymphatic: No cervical or submandibular  lymphadenopathy Genitourinary/Anorectal: No acute findings Meds ordered this encounter  Medications  . permethrin (ELIMITE) 5 % cream    Sig: Apply 1 application topically once. Apply medication at bedtime tonight leave overnight and wash off in the morning    Dispense:  120 g    Refill:  0    LABS: Results for orders placed or performed in visit on 01/28/15  POCT urine pregnancy  Result Value Ref Range   Preg Test, Ur Negative Negative    EKG/XRAY:   Primary read interpreted by Dr. Everlene Farrier at [facility]. Two-view C-spine no fracture seen  ASSESSMENT/PLAN: We'll treat with Elimite for her bites. She will apply heat and ice for her neck discomfort Gross sideeffects, risk and benefits, and alternatives of medications d/w patient. Patient is aware that all medications have potential sideeffects and we are unable to predict every sideeffect or drug-drug interaction that may occur.  By signing my name below, I, Nadim Abuhashem, attest that this documentation has been prepared under the direction and in the presence of Nena Jordan, MD.  Electronically Signed: Lora Havens, medical scribe. 02/04/2015, 10:50 AM.  Arlyss Queen MD 02/04/2015 10:50 AM

## 2015-02-13 ENCOUNTER — Telehealth: Payer: Self-pay

## 2015-02-13 DIAGNOSIS — Z0271 Encounter for disability determination: Secondary | ICD-10-CM

## 2015-02-13 NOTE — Telephone Encounter (Signed)
Patient brought in FMLA paperwork to be completed by Dr. Carlean Jews. I have filled out what I could from the [facility] notes as well as highlighted what needs to be completed. I will  Place them in your box on 02/13/15 please completed and return to the FMLA box at the 102 check out desk within 5-7 business days.  Patient wants the forms faxed to [phone] as well as call her at [phone] so she can come pick them up.

## 2015-02-14 NOTE — Telephone Encounter (Signed)
Forms completed scanned and faxed on 02/14/15 also put in pick up draw for her to come by and get.

## 2015-02-28 ENCOUNTER — Ambulatory Visit (INDEPENDENT_AMBULATORY_CARE_PROVIDER_SITE_OTHER): Payer: BC Managed Care – PPO | Admitting: Family Medicine

## 2015-02-28 VITALS — BP 108/72 | HR 76 | Temp 98.4°F | Resp 18 | Ht 66.0 in | Wt 172.4 lb

## 2015-02-28 DIAGNOSIS — K529 Noninfective gastroenteritis and colitis, unspecified: Secondary | ICD-10-CM

## 2015-02-28 MED ORDER — ONDANSETRON 8 MG PO TBDP: 8.0000 mg | ORAL_TABLET | Freq: Three times a day (TID) | ORAL | Status: DC | PRN

## 2015-02-28 MED ORDER — DICYCLOMINE HCL 10 MG PO CAPS: 10.0000 mg | ORAL_CAPSULE | Freq: Three times a day (TID) | ORAL | Status: DC | PRN

## 2015-02-28 NOTE — Progress Notes (Signed)
Subjective:  This chart was scribed for Robyn Haber MD,  by Tamsen Roers, at [facility].  This patient was seen in room 11 and the patient's care was started at 10:28 AM.   Chief Complaint  Patient presents with   Nausea    x 1 day   Abdominal Pain    x 1 day   Back Pain    x 1 day   Diarrhea    x 1 day     Patient ID: Ashley [name], female    DOB: [birth year], 29 y.o.   MRN: [id number]  HPI  HPI Comments: Ashley [name] is a 29 y.o. female who presents to the [facility] complaining of multiple symptoms including nausea, abdominal pain, back pain and diarrhea onset last week.  She states her symptoms subsided for a couple of days and started again yesterday morning.  She was not able to eat anything since yesterday but is able to drink water. She has taken medication but denies any relief.  She is currently on her period.  She denies any light-headedness or dizziness. Patient is a Pharmacist, hospital at [employer]. She was not able to go to work yesterday or today.  She has had a tubal ligation in the past.     Patient Active Problem List   Diagnosis Date Noted   S/P tubal ligation 03/28/2013   Normal pregnancy, repeat 03/27/2013   SGA (small for gestational age), fetal, affecting care of mother, antepartum 03/27/2013   SVD (spontaneous vaginal delivery) 03/27/2013   UTI (lower urinary tract infection) 01/12/2012   Past Medical History  Diagnosis Date   Allergy     Zyrtec daily.   Genital herpes     HSV II.   Migraine    Chlamydia 05/11/2008   Scoliosis    Normal pregnancy, repeat 03/27/2013   SGA (small for gestational age), fetal, affecting care of mother, antepartum 03/27/2013   SVD (spontaneous vaginal delivery) 03/27/2013   S/P tubal ligation 03/28/2013   Past Surgical History  Procedure Laterality Date   Myringotomy with tube placement     Tubal ligation Bilateral 03/28/2013    Procedure: POST  PARTUM TUBAL LIGATION;  Surgeon: Thornell Sartorius, MD;  Location: [facility];  Service: Gynecology;  Laterality: Bilateral;   No Known Allergies Prior to Admission medications   Medication Sig Start Date End Date Taking? Authorizing Provider  permethrin (ELIMITE) 5 % cream Apply 1 application topically once. Apply medication at bedtime tonight leave overnight and wash off in the morning Patient not taking: Reported on 02/28/2015 02/04/15   Darlyne Russian, MD  valACYclovir (VALTREX) 500 MG tablet Take 1 tablet (500 mg total) by mouth 2 (two) times daily. Take for 3 days with outbreaks. Patient not taking: Reported on 02/28/2015 11/20/14   Nolene Bernheim, PA-C   Social History   Social History   Marital Status: Married    Spouse Name: N/A   Number of Children: N/A   Years of Education: N/A   Occupational History   Not on file.   Social History Main Topics   Smoking status: Never Smoker    Smokeless tobacco: Never Used   Alcohol Use: No   Drug Use: No   Sexual Activity: Yes    Birth Control/ Protection: Pill   Other Topics Concern   Not on file   Social History Narrative   Marital status: married x 2 years; happily married; no abuse.  Children: 1 daughter (45 months)      Lives: with husband, daughter.     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     Tobacco: none      Alcohol: none      Drugs: none      Exercise:          Review of Systems  Constitutional: Negative for fever and chills.  Eyes: Negative for pain, redness and itching.  Respiratory: Negative for cough, choking and shortness of breath.   Gastrointestinal: Positive for nausea, abdominal pain and diarrhea. Negative for vomiting and constipation.  Musculoskeletal: Positive for back pain. Negative for gait problem, neck pain and neck stiffness.  Neurological: Negative for dizziness and speech difficulty.       Objective:   Physical Exam  Constitutional: She is oriented to person,  place, and time. She appears well-developed and well-nourished. No distress.  HENT:  Head: Normocephalic and atraumatic.  Eyes: Pupils are equal, round, and reactive to light.  Neck: Normal range of motion.  Cardiovascular: Normal rate.   Pulmonary/Chest: Effort normal. No respiratory distress.  Abdominal: Soft. Bowel sounds are normal. There is no tenderness.  Musculoskeletal: Normal range of motion.  Neurological: She is alert and oriented to person, place, and time.  Skin: Skin is warm and dry.  Psychiatric: She has a normal mood and affect. Her behavior is normal.  Nursing note and vitals reviewed.  Filed Vitals:   02/28/15 0930  BP: 108/72  Pulse: 76  Temp: 98.4 F (36.9 C)  TempSrc: Oral  Resp: 18  Height: 5\' 6"  (1.676 m)  Weight: 172 lb 6.4 oz (78.2 kg)  SpO2: 98%          Assessment & Plan:   This chart was scribed in my presence and reviewed by me personally.    ICD-9-CM ICD-10-CM   1. Noninfectious gastroenteritis, unspecified 558.9 K52.9 dicyclomine (BENTYL) 10 MG capsule     ondansetron (ZOFRAN-ODT) 8 MG disintegrating tablet     Signed, Robyn Haber, MD

## 2015-02-28 NOTE — Patient Instructions (Signed)
Rotavirus Infection Rotaviruses are a group of viruses that cause acute stomach and bowel upset (gastroenteritis) in all ages. Rotavirus infection may also be called infantile diarrhea, winter diarrhea, acute nonbacterial infectious gastroenteritis, and acute viral gastroenteritis. It occurs especially in young children. Children 6 months to 30 years of age, premature infants, the elderly, and the immunocompromised are more likely to have severe symptoms.  CAUSES  Rotaviruses are transmitted by the fecal-oral route. This means the virus is spread by eating or drinking food or water that is contaminated with infected stool. The virus is most commonly spread from person to person when someone's hands are contaminated with infected stool. For example, infected food handlers may contaminate foods. This can occur with foods that require handling and no further cooking, such as salads, fruits, and hors d'oeuvres. Rotaviruses are quite stable. They can be hard to control and eliminate in water supplies. Rotaviruses are a common cause of infection and diarrhea in child-care settings. SYMPTOMS  Some children have no symptoms. The period after infection but before symptoms begin (incubation period) ranges from 1 to 3 days. Symptoms usually begin with vomiting. Diarrhea follows for 4 to 8 days. Other symptoms may include:  Low-grade fever.  Temporary dairy (lactose) intolerance.  Cough.  Runny nose. DIAGNOSIS  The disease is diagnosed by identifying the virus in the stool. A person with rotavirus diarrhea often has large numbers of viruses in his or her stool. TREATMENT  There is no cure for rotavirus infection. Most people develop an immune response that eventually gets rid of the virus. While this natural response develops, the virus can make you very ill. The majority of people affected are young infants, so the disease can be dangerous. The most common symptom is diarrhea. Diarrhea alone can cause severe  dehydration. It can also cause an electrolyte imbalance. Treatments are aimed at rehydration. Rehydration treatment can prevent the severe effects of dehydration. Antidiarrheal medicines are not recommended. Such medicines may prolong the infection, since they prevent you from passing the viruses out of your body. Severe diarrhea without fluid and electrolyte replacement may be life threatening. HOME CARE INSTRUCTIONS Ask your health care provider for specific rehydration instructions. SEEK IMMEDIATE MEDICAL CARE IF:   There is decreased urination.  You have a dry mouth, tongue, or lips.  You notice decreased tears or sunken eyes.  You have dry skin.  Your breathing is fast.  Your fingertip takes more than 2 seconds to turn pink again after a gentle squeeze.  There is blood in your vomit or stool.  Your abdomen is enlarged (distended) or very tender.  There is persistent vomiting. Most of this information is courtesy of the Center for Disease Control and Prevention of Food Illness Fact Sheet.   This information is not intended to replace advice given to you by your health care provider. Make sure you discuss any questions you have with your health care provider.   Document Released: 04/27/2005 Document Revised: 05/18/2014 Document Reviewed: 07/24/2010 Elsevier Interactive Patient Education [employer].

## 2015-07-26 ENCOUNTER — Ambulatory Visit (INDEPENDENT_AMBULATORY_CARE_PROVIDER_SITE_OTHER): Payer: BC Managed Care – PPO | Admitting: Internal Medicine

## 2015-07-26 VITALS — BP 118/72 | HR 64 | Temp 98.2°F | Resp 18 | Wt 157.8 lb

## 2015-07-26 DIAGNOSIS — N760 Acute vaginitis: Secondary | ICD-10-CM

## 2015-07-26 DIAGNOSIS — Z111 Encounter for screening for respiratory tuberculosis: Secondary | ICD-10-CM | POA: Diagnosis not present

## 2015-07-26 DIAGNOSIS — B9689 Other specified bacterial agents as the cause of diseases classified elsewhere: Secondary | ICD-10-CM

## 2015-07-26 DIAGNOSIS — B373 Candidiasis of vulva and vagina: Secondary | ICD-10-CM | POA: Diagnosis not present

## 2015-07-26 DIAGNOSIS — A499 Bacterial infection, unspecified: Secondary | ICD-10-CM

## 2015-07-26 DIAGNOSIS — N898 Other specified noninflammatory disorders of vagina: Secondary | ICD-10-CM

## 2015-07-26 DIAGNOSIS — B3731 Acute candidiasis of vulva and vagina: Secondary | ICD-10-CM

## 2015-07-26 LAB — POCT WET + KOH PREP
Trich by wet prep: ABSENT
YEAST BY KOH: ABSENT
Yeast by wet prep: ABSENT

## 2015-07-26 MED ORDER — METRONIDAZOLE 500 MG PO TABS
500.0000 mg | ORAL_TABLET | Freq: Two times a day (BID) | ORAL | Status: DC
Start: 2015-07-26 — End: 2015-10-02

## 2015-07-26 MED ORDER — FLUCONAZOLE 150 MG PO TABS: 150.0000 mg | ORAL_TABLET | Freq: Once | ORAL | Status: DC

## 2015-07-26 NOTE — Progress Notes (Signed)
Subjective:  By signing my name below, I, Ashley [name], attest that this documentation has been prepared under the direction and in the presence of Tami Lin, MD.  Electronically Signed: Thea [name], ED Scribe. 07/26/2015. 6:09 PM.   Patient ID: Ashley [name], female    DOB: Mar 11, 1986, 30 y.o.   MRN: [id number]  HPI Chief Complaint  Patient presents with  . Vaginitis    frequent yeast infections  . Form for work    HPI Comments: Ashley [name] is a 30 y.o. female who presents to the [facility] complaining of recurrent yeast infection for a couple years. She symptoms consist of vaginal itching and discharge. She has noticed the yeast infection occur 1-2 weeks prior to menses. She has tried multiple OTC medication over the years without relief. Her last pap smear was 2 years ago which was normal. She is curious about boric acid treatment. She has hx of tubal ligation. She has 1 partner and is monogamous. She reports hx of BV.   Pt needs a form filled out for work. 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 She is UTD immunizations.   Pt is otherwise healthy.   Patient Active Problem List   Diagnosis Date Noted  . S/P tubal ligation 03/28/2013  . Normal pregnancy, repeat 03/27/2013  . SGA ([name] for gestational age), fetal, affecting care of mother, antepartum 03/27/2013  . SVD (spontaneous vaginal delivery) 03/27/2013  . UTI (lower urinary tract infection) 01/12/2012   Past Surgical History  Procedure Laterality Date  . Myringotomy with tube placement    . Tubal ligation Bilateral 03/28/2013    Procedure: POST PARTUM TUBAL LIGATION;  Surgeon: Thornell Sartorius, MD;  Location: [facility];  Service: Gynecology;  Laterality: Bilateral;   Past Medical History  Diagnosis Date  . Allergy     Zyrtec daily.  . Genital herpes     HSV II.  . Migraine   . Chlamydia 05/11/2008  . Scoliosis   . Normal pregnancy,  repeat 03/27/2013  . SGA ([name] for gestational age), fetal, affecting care of mother, antepartum 03/27/2013  . SVD (spontaneous vaginal delivery) 03/27/2013  . S/P tubal ligation 03/28/2013   Prior to Admission medications   Medication Sig Start Date End Date Taking? Authorizing Provider  dicyclomine (BENTYL) 10 MG capsule Take 1 capsule (10 mg total) by mouth 3 (three) times daily as needed for spasms. 02/28/15  Yes Robyn Haber, MD  ondansetron (ZOFRAN-ODT) 8 MG disintegrating tablet Take 1 tablet (8 mg total) by mouth every 8 (eight) hours as needed for nausea. 02/28/15  Yes Robyn Haber, MD  valACYclovir (VALTREX) 500 MG tablet Take 1 tablet (500 mg total) by mouth 2 (two) times daily. Take for 3 days with outbreaks. Patient not taking: Reported on 02/28/2015 11/20/14   Jake Seats Brewington, PA-C    Review of Systems  Genitourinary: Positive for vaginal discharge.       Vaginal itching     Objective:   Physical Exam  Constitutional: She is oriented to person, place, and time. She appears well-developed and well-nourished. No distress.  HENT:  Head: Normocephalic and atraumatic.  Eyes: Conjunctivae and EOM are normal.  Neck: Neck supple.  Cardiovascular: Normal rate.   Pulmonary/Chest: Effort normal.  Genitourinary: Vaginal discharge found.  Introitus clear. There excessive vaginal discharge that is off white. Os is clear without friability.   Musculoskeletal: Normal range of motion.  Neurological: She is alert and oriented to person, place, and time.  Skin: Skin is warm and dry.  Psychiatric: She has a normal mood and affect. Her behavior is normal.  Nursing note and vitals reviewed.   Filed Vitals:   07/26/15 1750  BP: 118/72  Pulse: 64  Temp: 98.2 F (36.8 C)  TempSrc: Oral  Resp: 18  Weight: 157 lb 12.8 oz (71.578 kg)  SpO2: 98%    Results for orders placed or performed in visit on 07/26/15  POCT Wet + KOH Prep  Result Value Ref Range   Yeast by KOH  Absent Present, Absent   Yeast by wet prep Absent Present, Absent   WBC by wet prep Few None, Few, Too numerous to count   Clue Cells Wet Prep HPF POC Few (A) None, Too numerous to count   Trich by wet prep Absent Present, Absent   Bacteria Wet Prep HPF POC Few None, Few, Too numerous to count   Epithelial Cells By [employer] ([facility]) Many (A) None, Few, Too numerous to count   RBC,UR,HPF,POC None None RBC/hpf    Assessment & Plan:  I have completed the patient encounter in its entirety as documented by the scribe, with editing by me where necessary. Ashley [name], M.D.  Vaginal discharge - Plan: POCT Wet + KOH Prep, GC/Chlamydia Probe Amp  Screening-pulmonary TB - Plan: TB Skin Test  Vaginal yeast infection--chr recurrent  BV (bacterial vaginosis)-flagyl  Then start probiotics plus diflu p menses x 3  Meds ordered this encounter  Medications  . metroNIDAZOLE (FLAGYL) 500 MG tablet    Sig: Take 1 tablet (500 mg total) by mouth 2 (two) times daily.    Dispense:  14 tablet    Refill:  0  . fluconazole (DIFLUCAN) 150 MG tablet    Sig: Take 1 tablet (150 mg total) by mouth once. At onset of each of next yeast infections    Dispense:  1 tablet    Refill:  2      Screening-pulmonary TB - Plan: TB Skin Test

## 2015-07-26 NOTE — Patient Instructions (Addendum)
Probiotics---use align or renew life ultimate flora

## 2015-07-29 ENCOUNTER — Ambulatory Visit (INDEPENDENT_AMBULATORY_CARE_PROVIDER_SITE_OTHER): Payer: BC Managed Care – PPO

## 2015-07-29 DIAGNOSIS — Z111 Encounter for screening for respiratory tuberculosis: Secondary | ICD-10-CM

## 2015-07-29 LAB — GC/CHLAMYDIA PROBE AMP
CT PROBE, AMP APTIMA: NOT DETECTED
GC Probe RNA: NOT DETECTED

## 2015-07-29 LAB — TB SKIN TEST
INDURATION: 0 mm
TB SKIN TEST: NEGATIVE

## 2015-07-29 NOTE — Progress Notes (Signed)
Pt. Was here for a PPD reading. Results negative, induration 0.10mm. Pt. Received a copy of her results and had her paperwork filled out,

## 2015-08-01 ENCOUNTER — Ambulatory Visit (INDEPENDENT_AMBULATORY_CARE_PROVIDER_SITE_OTHER): Payer: BC Managed Care – PPO | Admitting: Physician Assistant

## 2015-08-01 ENCOUNTER — Telehealth: Payer: Self-pay

## 2015-08-01 VITALS — BP 110/82 | HR 77 | Temp 98.2°F | Resp 16 | Ht 66.0 in | Wt 157.0 lb

## 2015-08-01 DIAGNOSIS — Z30011 Encounter for initial prescription of contraceptive pills: Secondary | ICD-10-CM

## 2015-08-01 DIAGNOSIS — N946 Dysmenorrhea, unspecified: Secondary | ICD-10-CM

## 2015-08-01 MED ORDER — LEVONORGEST-ETH ESTRAD 91-DAY 0.15-0.03 MG PO TABS: 1.0000 | ORAL_TABLET | Freq: Every day | ORAL | Status: DC

## 2015-08-01 MED ORDER — CELECOXIB 200 MG PO CAPS: ORAL_CAPSULE | ORAL | Status: DC

## 2015-08-01 NOTE — Patient Instructions (Addendum)
IF you received an x-ray today, you will receive an invoice from [facility]. Please contact [facility] at [phone] with questions or concerns regarding your invoice.   IF you received labwork today, you will receive an invoice from [employer]. Please contact [facility] at [phone] with questions or concerns regarding your invoice.   Our billing staff will not be able to assist you with questions regarding bills from these companies.  You will be contacted with the lab results as soon as they are available. The fastest way to get your results is to activate your My Chart account. Instructions are located on the last page of this paperwork. If you have not heard from [place] regarding the results in 2 weeks, please contact this office.    Dysmenorrhea Menstrual cramps (dysmenorrhea) are caused by the muscles of the uterus tightening (contracting) during a menstrual period. For some women, this discomfort is merely bothersome. For others, dysmenorrhea can be severe enough to interfere with everyday activities for a few days each month. Primary dysmenorrhea is menstrual cramps that last a couple of days when you start having menstrual periods or soon after. This often begins after a teenager starts having her period. As a woman gets older or has a baby, the cramps will usually lessen or disappear. Secondary dysmenorrhea begins later in life, lasts longer, and the pain may be stronger than primary dysmenorrhea. The pain may start before the period and last a few days after the period.  CAUSES  Dysmenorrhea is usually caused by an underlying problem, such as:  The tissue lining the uterus grows outside of the uterus in other areas of the body (endometriosis).  The endometrial tissue, which normally lines the uterus, is found in or grows into the muscular walls of the uterus (adenomyosis).  The pelvic blood vessels are engorged with blood just  before the menstrual period (pelvic congestive syndrome).  Overgrowth of cells (polyps) in the lining of the uterus or cervix.  Falling down of the uterus (prolapse) because of loose or stretched ligaments.  Depression.  Bladder problems, infection, or inflammation.  Problems with the intestine, a tumor, or irritable bowel syndrome.  Cancer of the female organs or bladder.  A severely tipped uterus.  A very tight opening or closed cervix.  Noncancerous tumors of the uterus (fibroids).  Pelvic inflammatory disease (PID).  Pelvic scarring (adhesions) from a previous surgery.  Ovarian cyst.  An intrauterine device (IUD) used for birth control. RISK FACTORS You may be at greater risk of dysmenorrhea if:  You are younger than age 37.  You started puberty early.  You have irregular or heavy bleeding.  You have never given birth.  You have a family history of this problem.  You are a smoker. SIGNS AND SYMPTOMS   Cramping or throbbing pain in your lower abdomen.  Headaches.  Lower back pain.  Nausea or vomiting.  Diarrhea.  Sweating or dizziness.  Loose stools. DIAGNOSIS  A diagnosis is based on your history, symptoms, physical exam, diagnostic tests, or procedures. Diagnostic tests or procedures may include:  Blood tests.  Ultrasonography.  An examination of the lining of the uterus (dilation and curettage, D&C).  An examination inside your abdomen or pelvis with a scope (laparoscopy).  X-rays.  CT scan.  MRI.  An examination inside the bladder with a scope (cystoscopy).  An examination inside the intestine or stomach with a scope (colonoscopy, gastroscopy). TREATMENT  Treatment depends on the cause of  the dysmenorrhea. Treatment may include:  Pain medicine prescribed by your health care provider.  Birth control pills or an IUD with progesterone hormone in it.  Hormone replacement therapy.  Nonsteroidal anti-inflammatory drugs (NSAIDs).  These may help stop the production of prostaglandins.  Surgery to remove adhesions, endometriosis, ovarian cyst, or fibroids.  Removal of the uterus (hysterectomy).  Progesterone shots to stop the menstrual period.  Cutting the nerves on the sacrum that go to the female organs (presacral neurectomy).  Electric current to the sacral nerves (sacral nerve stimulation).  Antidepressant medicine.  Psychiatric therapy, counseling, or group therapy.  Exercise and physical therapy.  Meditation and yoga therapy.  Acupuncture. HOME CARE INSTRUCTIONS   Only take over-the-counter or prescription medicines as directed by your health care provider.  Place a heating pad or hot water bottle on your lower back or abdomen. Do not sleep with the heating pad.  Use aerobic exercises, walking, swimming, biking, and other exercises to help lessen the cramping.  Massage to the lower back or abdomen may help.  Stop smoking.  Avoid alcohol and caffeine. SEEK MEDICAL CARE IF:   Your pain does not get better with medicine.  You have pain with sexual intercourse.  Your pain increases and is not controlled with medicines.  You have abnormal vaginal bleeding with your period.  You develop nausea or vomiting with your period that is not controlled with medicine. SEEK IMMEDIATE MEDICAL CARE IF:  You pass out.    This information is not intended to replace advice given to you by your health care provider. Make sure you discuss any questions you have with your health care provider.   Document Released: 04/27/2005 Document Revised: 12/28/2012 Document Reviewed: 10/13/2012 Elsevier Interactive Patient Education [employer].

## 2015-08-01 NOTE — Progress Notes (Signed)
[facility] [address] [place] [address] [phone]  Date:  08/01/2015   Name:  Ashley [name]   DOB:  Oct 01, 1985   MRN:  [id number]  PCP:  No PCP Per Patient    History of Present Illness:  Ashley [name] is a 30 y.o. female patient who presents to [facility] for contraceptive counseling.  Patient is here today for contraceptives to prevent her period from starting in 1 week.  She has an event, and is concerned with heavy bleeding and cramping.  The menses will last for 4-6 days.  She changes tampons every 2-2.5 tampons.  She rarely has mood changes.      She has a tubal ligation.  She is a non-smoker.  She has no familial or personal hx of blood clotting. She has been followed by a gynecologist-- was told that she has small fibroids, however are non-concerning.  Her last child is 76 years old.   Patient Active Problem List   Diagnosis Date Noted  . S/P tubal ligation 03/28/2013  . Normal pregnancy, repeat 03/27/2013  . SGA (small for gestational age), fetal, affecting care of mother, antepartum 03/27/2013  . SVD (spontaneous vaginal delivery) 03/27/2013  . UTI (lower urinary tract infection) 01/12/2012    Past Medical History  Diagnosis Date  . Allergy     Zyrtec daily.  . Genital herpes     HSV II.  . Migraine   . Chlamydia 05/11/2008  . Scoliosis   . Normal pregnancy, repeat 03/27/2013  . SGA (small for gestational age), fetal, affecting care of mother, antepartum 03/27/2013  . SVD (spontaneous vaginal delivery) 03/27/2013  . S/P tubal ligation 03/28/2013    Past Surgical History  Procedure Laterality Date  . Myringotomy with tube placement    . Tubal ligation Bilateral 03/28/2013    Procedure: POST PARTUM TUBAL LIGATION;  Surgeon: Thornell Sartorius, MD;  Location: [facility];  Service: Gynecology;  Laterality: Bilateral;    Social History  Substance Use Topics  . Smoking status: Never Smoker   . Smokeless tobacco: Never Used  . Alcohol Use: No    Family  History  Problem Relation Age of Onset  . Hypertension Mother   . Asthma Mother   . Hypertension Maternal Grandmother   . Hypertension Paternal Grandfather   . Heart disease Paternal Grandfather     No Known Allergies  Medication list has been reviewed and updated.  Current Outpatient Prescriptions on File Prior to Visit  Medication Sig Dispense Refill  . metroNIDAZOLE (FLAGYL) 500 MG tablet Take 1 tablet (500 mg total) by mouth 2 (two) times daily. 14 tablet 0  . dicyclomine (BENTYL) 10 MG capsule Take 1 capsule (10 mg total) by mouth 3 (three) times daily as needed for spasms. (Patient not taking: Reported on 08/01/2015) 15 capsule 1  . fluconazole (DIFLUCAN) 150 MG tablet Take 1 tablet (150 mg total) by mouth once. At onset of each of next yeast infections (Patient not taking: Reported on 08/01/2015) 1 tablet 2  . ondansetron (ZOFRAN-ODT) 8 MG disintegrating tablet Take 1 tablet (8 mg total) by mouth every 8 (eight) hours as needed for nausea. (Patient not taking: Reported on 08/01/2015) 10 tablet 0  . valACYclovir (VALTREX) 500 MG tablet Take 1 tablet (500 mg total) by mouth 2 (two) times daily. Take for 3 days with outbreaks. (Patient not taking: Reported on 02/28/2015) 30 tablet 1   No current facility-administered medications on file prior to visit.  ROS ROS otherwise unremarkable unless listed above.   Physical Examination: BP 110/82 mmHg  Pulse 77  Temp(Src) 98.2 F (36.8 C)  Resp 16  Ht 5\' 6"  (1.676 m)  Wt 157 lb (71.215 kg)  BMI 25.35 kg/m2  SpO2 99%  LMP 07/06/2015 Ideal Body Weight: Weight in (lb) to have BMI = 25: 154.6  Physical Exam  Constitutional: She is oriented to person, place, and time. She appears well-developed and well-nourished. No distress.  HENT:  Head: Normocephalic and atraumatic.  Right Ear: External ear normal.  Left Ear: External ear normal.  Eyes: Conjunctivae and EOM are normal. Pupils are equal, round, and reactive to light.   Cardiovascular: Normal rate.   Pulmonary/Chest: Effort normal. No respiratory distress.  Neurological: She is alert and oriented to person, place, and time.  Skin: She is not diaphoretic.  Psychiatric: She has a normal mood and affect. Her behavior is normal.     Assessment and Plan: Ashley [name] is a 30 y.o. female who is here today   Encounter for initial prescription of contraceptive pills - Plan: levonorgestrel-ethinyl estradiol (SEASONALE,INTROVALE,JOLESSA) 0.15-0.03 MG tablet, celecoxib (CELEBREX) 200 MG capsule  Dysmenorrhea - Plan: celecoxib (CELEBREX) 200 MG capsule  Ivar Drape, PA-C [facility] 3/23/20178:06 PM

## 2015-08-01 NOTE — Telephone Encounter (Signed)
Patient request for Norethisterone to be called into her pharmacy [facility]. Patient stated she need her menstrual to stop. [phone].

## 2015-08-02 NOTE — Telephone Encounter (Signed)
Pt came in to be seen

## 2015-09-13 ENCOUNTER — Ambulatory Visit (INDEPENDENT_AMBULATORY_CARE_PROVIDER_SITE_OTHER): Payer: BC Managed Care – PPO | Admitting: Physician Assistant

## 2015-09-13 VITALS — BP 118/70 | HR 93 | Temp 98.8°F | Resp 17 | Ht 66.0 in | Wt 155.0 lb

## 2015-09-13 DIAGNOSIS — R197 Diarrhea, unspecified: Secondary | ICD-10-CM

## 2015-09-13 DIAGNOSIS — A09 Infectious gastroenteritis and colitis, unspecified: Secondary | ICD-10-CM

## 2015-09-13 LAB — POCT CBC
GRANULOCYTE PERCENT: 65.3 % (ref 37–80)
HCT, POC: 37.2 % — AB (ref 37.7–47.9)
Hemoglobin: 12.6 g/dL (ref 12.2–16.2)
Lymph, poc: 1.7 (ref 0.6–3.4)
MCH: 28.4 pg (ref 27–31.2)
MCHC: 33.8 g/dL (ref 31.8–35.4)
MCV: 84 fL (ref 80–97)
MID (CBC): 0.6 (ref 0–0.9)
MPV: 7.4 fL (ref 0–99.8)
POC Granulocyte: 4.4 (ref 2–6.9)
POC LYMPH PERCENT: 25.2 %L (ref 10–50)
POC MID %: 9.5 %M (ref 0–12)
Platelet Count, POC: 238 10*3/uL (ref 142–424)
RBC: 4.42 M/uL (ref 4.04–5.48)
RDW, POC: 14 %
WBC: 6.7 10*3/uL (ref 4.6–10.2)

## 2015-09-13 MED ORDER — CETIRIZINE-PSEUDOEPHEDRINE ER 5-120 MG PO TB12: 1.0000 | ORAL_TABLET | Freq: Two times a day (BID) | ORAL | Status: DC

## 2015-09-13 NOTE — Patient Instructions (Signed)
     IF you received an x-ray today, you will receive an invoice from [facility]. Please contact [facility] at [phone] with questions or concerns regarding your invoice.   IF you received labwork today, you will receive an invoice from [facility]. Please contact [facility] at [phone] with questions or concerns regarding your invoice.   Our billing staff will not be able to assist you with questions regarding bills from these companies.  You will be contacted with the lab results as soon as they are available. The fastest way to get your results is to activate your My Chart account. Instructions are located on the last page of this paperwork. If you have not heard from us regarding the results in 2 weeks, please contact this office.      

## 2015-09-13 NOTE — Progress Notes (Signed)
09/13/2015 6:14 PM   DOB: [birth year] / MRN: [id number]  SUBJECTIVE:  Ashley [name] is a 30 y.o. female presenting for multiple complaints. Complains allergic URI symptoms. Scratchy eyes, nasal congestion, ear and throat itching.  Denies fever.  Has been taking flonase for 48 hours with some improvement.   Has been having mild bloody diarrhea preceded by fecal urgency and watery non bloody diarrhea. States her son, baby have had a "stomach flu" and have all recovered. She is the last one to fall ill and has been having symptoms for 3 days now.  She denies any pain, fever, and is tolerating po fluids and solids. She feels the diarrhea is becoming less frequent.   She has No Known Allergies.   She  has a past medical history of Allergy; Genital herpes; Migraine; Chlamydia (05/11/2008); Scoliosis; Normal pregnancy, repeat (03/27/2013); SGA (small for gestational age), fetal, affecting care of mother, antepartum (03/27/2013); SVD (spontaneous vaginal delivery) (03/27/2013); and S/P tubal ligation (03/28/2013).    She  reports that she has never smoked. She has never used smokeless tobacco. She reports that she does not drink alcohol or use illicit drugs. She  reports that she currently engages in sexual activity. She reports using the following method of birth control/protection: Pill. The patient  has past surgical history that includes Myringotomy with tube placement and Tubal ligation (Bilateral, 03/28/2013).  Her family history includes Asthma in her mother; Heart disease in her paternal grandfather; Hypertension in her maternal grandmother, mother, and paternal grandfather.  Review of Systems  Constitutional: Negative for fever.  Eyes: Positive for redness.  Respiratory: Negative for cough, shortness of breath and wheezing.   Cardiovascular: Negative for chest pain.  Gastrointestinal: Positive for diarrhea and blood in stool.  Genitourinary: Negative for dysuria.  Musculoskeletal: Negative for  myalgias.  Skin: Negative for rash.  Neurological: Negative for dizziness and headaches.    Problem list and medications reviewed and updated by myself where necessary, and exist elsewhere in the encounter.   OBJECTIVE:  BP 118/70 mmHg  Pulse 93  Temp(Src) 98.8 F (37.1 C) (Oral)  Resp 17  Ht 5\' 6"  (1.676 m)  Wt 155 lb (70.308 kg)  BMI 25.03 kg/m2  SpO2 99%  LMP 08/23/2015  Physical Exam  Constitutional: She is oriented to person, place, and time. She appears well-nourished. No distress.  Eyes: EOM are normal. Pupils are equal, round, and reactive to light.  Cardiovascular: Normal rate, regular rhythm and normal heart sounds.   Pulmonary/Chest: Effort normal and breath sounds normal.  Abdominal: Soft. Bowel sounds are normal. She exhibits no distension and no mass. There is no tenderness. There is no rebound and no guarding.  Neurological: She is alert and oriented to person, place, and time. No cranial nerve deficit. Gait normal.  Skin: Skin is dry. She is not diaphoretic.  Psychiatric: She has a normal mood and affect.  Vitals reviewed.   Results for orders placed or performed in visit on 09/13/15 (from the past 72 hour(s))  POCT CBC     Status: Abnormal   Collection Time: 09/13/15  6:10 PM  Result Value Ref Range   WBC 6.7 4.6 - 10.2 K/uL   Lymph, poc 1.7 0.6 - 3.4   POC LYMPH PERCENT 25.2 10 - 50 %L   MID (cbc) 0.6 0 - 0.9   POC MID % 9.5 0 - 12 %M   POC Granulocyte 4.4 2 - 6.9   Granulocyte percent 65.3 37 - 80 %G  RBC 4.42 4.04 - 5.48 M/uL   Hemoglobin 12.6 12.2 - 16.2 g/dL   HCT, POC 37.2 (A) 37.7 - 47.9 %   MCV 84.0 80 - 97 fL   MCH, POC 28.4 27 - 31.2 pg   MCHC 33.8 31.8 - 35.4 g/dL   RDW, POC 14.0 %   Platelet Count, POC 238 142 - 424 K/uL   MPV 7.4 0 - 99.8 fL    No results found.  ASSESSMENT AND PLAN  Ashley [name] was seen today for sinusitis, abdominal pain and diarrhea.  Diagnoses and all orders for this visit:  Bloody diarrhea: Her GI exam and  CBC are very reassuring.  She is improving. If this were a bacterial etiology and she is improving a culture would like change nothing at this point as Abx would not be indicated given she is doing so well.  Advised that if these symptoms continue will order a GI pathogen PCR panel.   -     POCT CBC  Allergic rhinitis: Continue flonase.  -     cetirizine-pseudoephedrine (ZYRTEC-D ALLERGY & CONGESTION) 5-120 MG tablet; Take 1 tablet by mouth 2 (two) times daily.   The patient was advised to call or return to clinic if she does not see an improvement in symptoms or to seek the care of the closest emergency department if she worsens with the above plan.   Philis Fendt, MHS, PA-C [facility] 09/13/2015 6:14 PM

## 2015-09-16 ENCOUNTER — Telehealth: Payer: Self-pay

## 2015-09-16 NOTE — Telephone Encounter (Signed)
PATIENT STATES SHE WAS IN THE OFFICE AND SAW MARIO MANI ON Friday FOR SINUS AND STOMACH PROBLEMS. SHE FORGOT TO GET A NOTE FOR HER WORK. SHE WAS OUT ON THUR. 09/12/2015, FRI. 09/13/2015 AND HALF THE DAY ON MON. 09/16/2015. SHE WILL RETURN TO WORK ON Monday AFTERNOON. PLEASE CALL [facility] WHEN IT CAN BE PICKED UP. BEST PHONE [phone] (CELL)  [place]

## 2015-09-16 NOTE — Telephone Encounter (Signed)
Advised pt ready. Faxed to employer

## 2015-10-02 ENCOUNTER — Ambulatory Visit (INDEPENDENT_AMBULATORY_CARE_PROVIDER_SITE_OTHER): Payer: BC Managed Care – PPO | Admitting: Physician Assistant

## 2015-10-02 VITALS — BP 120/78 | HR 78 | Temp 98.8°F | Resp 16 | Ht 66.0 in | Wt 152.4 lb

## 2015-10-02 DIAGNOSIS — N9489 Other specified conditions associated with female genital organs and menstrual cycle: Secondary | ICD-10-CM | POA: Diagnosis not present

## 2015-10-02 DIAGNOSIS — J029 Acute pharyngitis, unspecified: Secondary | ICD-10-CM | POA: Diagnosis not present

## 2015-10-02 DIAGNOSIS — N898 Other specified noninflammatory disorders of vagina: Secondary | ICD-10-CM

## 2015-10-02 LAB — POCT RAPID STREP A (OFFICE): Rapid Strep A Screen: NEGATIVE

## 2015-10-02 LAB — POCT WET + KOH PREP
TRICH BY WET PREP: ABSENT
Yeast by KOH: ABSENT
Yeast by wet prep: ABSENT

## 2015-10-02 MED ORDER — METRONIDAZOLE 500 MG PO TABS: 500.0000 mg | ORAL_TABLET | Freq: Two times a day (BID) | ORAL | Status: DC

## 2015-10-02 NOTE — Patient Instructions (Signed)
     IF you received an x-ray today, you will receive an invoice from [facility]. Please contact [facility] at [phone] with questions or concerns regarding your invoice.   IF you received labwork today, you will receive an invoice from [facility]. Please contact [facility] at [phone] with questions or concerns regarding your invoice.   Our billing staff will not be able to assist you with questions regarding bills from these companies.  You will be contacted with the lab results as soon as they are available. The fastest way to get your results is to activate your My Chart account. Instructions are located on the last page of this paperwork. If you have not heard from us regarding the results in 2 weeks, please contact this office.      

## 2015-10-02 NOTE — Progress Notes (Signed)
10/02/2015 8:59 AM   DOB: 1986-04-30 / MRN: [id number]  SUBJECTIVE:  Ashley [name] is a 30 y.o. female presenting for sore throat that started yesterday.  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 Complains of mild cough, nasal congestion, and anterior neck tenderness.  Denies fever and chills.  Is eating and drinking without difficulty.   Complains of vaginal odor. States this started a few days ago just after menstruation.  Associates thin grey vaginal discharge.  Has a history of BV   She has No Known Allergies.   She  has a past medical history of Allergy; Genital herpes; Migraine; Chlamydia (05/11/2008); Scoliosis; Normal pregnancy, repeat (03/27/2013); SGA (small for gestational age), fetal, affecting care of mother, antepartum (03/27/2013); SVD (spontaneous vaginal delivery) (03/27/2013); and S/P tubal ligation (03/28/2013).    She  reports that she has never smoked. She has never used smokeless tobacco. She reports that she does not drink alcohol or use illicit drugs. She  reports that she currently engages in sexual activity. She reports using the following method of birth control/protection: Pill. The patient  has past surgical history that includes Myringotomy with tube placement and Tubal ligation (Bilateral, 03/28/2013).  Her family history includes Asthma in her mother; Heart disease in her paternal grandfather; Hypertension in her maternal grandmother, mother, and paternal grandfather.  Review of Systems  Constitutional: Negative for fever and chills.  Respiratory: Negative for cough.   Gastrointestinal: Negative for diarrhea.  Genitourinary: Negative for dysuria.  Musculoskeletal: Negative for myalgias.  Skin: Negative for rash.  Neurological: Negative for dizziness and headaches.    Problem list and medications reviewed and updated by myself where necessary, and exist elsewhere in the encounter.   OBJECTIVE:  BP 120/78 mmHg  Pulse 78   Temp(Src) 98.8 F (37.1 C) (Oral)  Resp 16  Ht 5\' 6"  (1.676 m)  Wt 152 lb 6.4 oz (69.128 kg)  BMI 24.61 kg/m2  SpO2 99%  LMP 09/11/2015  Physical Exam  Constitutional: She is oriented to person, place, and time. Vital signs are normal. She appears well-developed. No distress.  HENT:  Right Ear: External ear normal.  Left Ear: External ear normal.  Nose: Nose normal.  Mouth/Throat: Oropharynx is clear and moist. No oropharyngeal exudate.  Eyes: Pupils are equal, round, and reactive to light.  Cardiovascular: Normal rate and regular rhythm.   Pulmonary/Chest: Effort normal.  Abdominal: Soft. Bowel sounds are normal.  Neurological: She is alert and oriented to person, place, and time.  Skin: Skin is warm and dry. She is not diaphoretic.    Results for orders placed or performed in visit on 10/02/15 (from the past 72 hour(s))  POCT rapid strep A     Status: None   Collection Time: 10/02/15  8:53 AM  Result Value Ref Range   Rapid Strep A Screen Negative Negative    No results found.  ASSESSMENT AND PLAN  Tykeria was seen today for sore throat and vaginal discharge.  Diagnoses and all orders for this visit:  Vaginal odor -     POCT Wet + KOH Prep  Sore throat: Likely viral.  Advised symptomatic therapy.  -     POCT rapid strep A -     Culture, Group A Strep    The patient was advised to call or return to clinic if she does not see an improvement in symptoms or to seek the care of the closest emergency department if she worsens with the  above plan.   Philis Fendt, MHS, PA-C [facility] 10/02/2015 8:59 AM

## 2015-10-04 LAB — CULTURE, GROUP A STREP

## 2015-10-21 ENCOUNTER — Ambulatory Visit (INDEPENDENT_AMBULATORY_CARE_PROVIDER_SITE_OTHER): Payer: BC Managed Care – PPO | Admitting: Physician Assistant

## 2015-10-21 VITALS — BP 112/74 | HR 81 | Temp 98.4°F | Resp 17 | Ht 66.0 in | Wt 155.0 lb

## 2015-10-21 DIAGNOSIS — N898 Other specified noninflammatory disorders of vagina: Secondary | ICD-10-CM | POA: Diagnosis not present

## 2015-10-21 LAB — POCT WET + KOH PREP: TRICH BY WET PREP: ABSENT

## 2015-10-21 MED ORDER — FLUCONAZOLE 150 MG PO TABS: 150.0000 mg | ORAL_TABLET | Freq: Once | ORAL | Status: DC

## 2015-10-21 MED ORDER — AMBULATORY NON FORMULARY MEDICATION: Status: DC

## 2015-10-21 NOTE — Patient Instructions (Signed)
     IF you received an x-ray today, you will receive an invoice from [facility]. Please contact [facility] at [phone] with questions or concerns regarding your invoice.   IF you received labwork today, you will receive an invoice from [facility]. Please contact [facility] at [phone] with questions or concerns regarding your invoice.   Our billing staff will not be able to assist you with questions regarding bills from these companies.  You will be contacted with the lab results as soon as they are available. The fastest way to get your results is to activate your My Chart account. Instructions are located on the last page of this paperwork. If you have not heard from us regarding the results in 2 weeks, please contact this office.      

## 2015-10-21 NOTE — Progress Notes (Signed)
Subjective:    Patient ID: Ashley [name], female    DOB: [birth year], 30 y.o.   MRN: [id number] Chief Complaint  Patient presents with  . Vaginitis    HPI  Patient is a 30yo female who presents for evaluation of an abnormal vaginal discharge. Symptoms have been present for 1 day. Vaginal symptoms: discharge described as white and milky, local irritation, odor, pain, vulvar itching and swelling. She denies abnormal bleeding, blisters, burning, dyspareunia, post coital bleeding and urinary symptoms of cloudy urine, dysuria, fever pelvic pain, hematuria, lower abdominal pain, nausea, urinary frequency, urinary urgency and vomiting.  Denies association with intercourse or menstural cycle.   Took diflucan yesterday, prescribed for chronic yeast infections.  Reports 2 episodes of bacterial vaginosis in the last 3 months, both treated with Flagyl, finished last course of Flagyl 2 weeks ago.  History of chronic yeast infections since birth of her 2nd child.  Admits to changing diet to reduce foods, such as pineapple, which were associated with her recurrent infections.  Started taking probiotics to increase natural flora, noted improvement until recently.  States yeast infections use to coordinate with menstural cycle but have been occuring at intermittently.  Patient in a monogamous relationship with husband x 6 years, low risk of new STI infection.  Past history of HSV treated symptomatically with Valtrex.  Admits she has not had sexual intercourse in last 48 hours.  Menstrual flow: regular, every 28-30 days, associated with painful crampying. LMP: 10/13/2015  Contraception: s/p tubal ligation.   Interested in Boric acid suppository for treatment of chronic infections.   Review of Systems  Patient Active Problem List   Diagnosis Date Noted  . S/P tubal ligation 03/28/2013  . Normal pregnancy, repeat 03/27/2013  . SGA (small for gestational age), fetal, affecting care of mother, antepartum  03/27/2013  . SVD (spontaneous vaginal delivery) 03/27/2013  . UTI (lower urinary tract infection) 01/12/2012    Current Outpatient Prescriptions on File Prior to Visit  Medication Sig Dispense Refill  . celecoxib (CELEBREX) 200 MG capsule Take 2 capsules by mouth.  You may take 1 additional capsule by mouth. 30 capsule 0  . cetirizine-pseudoephedrine (ZYRTEC-D ALLERGY & CONGESTION) 5-120 MG tablet Take 1 tablet by mouth 2 (two) times daily. 14 tablet 0  . dicyclomine (BENTYL) 10 MG capsule Take 1 capsule (10 mg total) by mouth 3 (three) times daily as needed for spasms. 15 capsule 1  . valACYclovir (VALTREX) 500 MG tablet Take 1 tablet (500 mg total) by mouth 2 (two) times daily. Take for 3 days with outbreaks. 30 tablet 1  . metroNIDAZOLE (FLAGYL) 500 MG tablet Take 1 tablet (500 mg total) by mouth 2 (two) times daily. Do not consume alcohol with this medication. (Patient not taking: Reported on 10/21/2015) 14 tablet 0   No current facility-administered medications on file prior to visit.   No Known Allergies     Objective:   Physical Exam  Constitutional: She is oriented to person, place, and time. She appears well-developed and well-nourished.  Cardiovascular: Normal rate, regular rhythm, normal heart sounds and intact distal pulses.   Pulmonary/Chest: Effort normal and breath sounds normal.  Abdominal: Soft. She exhibits no distension. There is no tenderness. There is no rebound.  Genitourinary: Uterus is not enlarged and not tender. Cervix exhibits no discharge and no friability. Right adnexum displays no tenderness. Left adnexum displays no tenderness. Vaginal discharge found.  Swelling of labia minora bilaterally. Copious thick white milky discharge in vaginal vault.  Neurological: She is alert and oriented to person, place, and time.  Skin: Skin is warm and dry.  Psychiatric: She has a normal mood and affect. Her behavior is normal. Judgment and thought content normal.    Microscopic wet-mount exam shows excessive bacteria, white blood cells.      Assessment & Plan:  1. Vaginal discharge - POCT Wet + KOH Prep - AMBULATORY NON FORMULARY MEDICATION; Boric Acid Suppository 600 mg Insert 1 PV twice weekly to maintain vaginal flora  Dispense: 10 suppository; Refill: 5 - fluconazole (DIFLUCAN) 150 MG tablet; Take 1 tablet (150 mg total) by mouth once. Repeat if needed  Dispense: 2 tablet; Refill: 0  KOH prep is positive for yeast infection.  Patient prescribed fluconazole and told to wait 1 week, because she took 1 dose yesterday and take another dose if symptoms persist. Discussed management of chronic yeast infections with boric acid and prescribed treatment.  Instructed patient to insert 1 suppository twice weekly to maintain vaginal flora.   Recommended patient continue probiotics and dietary changes that have been beneficial in preventing reoccurrence.    Patient is to follow up if symptoms persist or return or if she has any additional questions or concerns.  Amor Hyle P. Dicky Boer, PA-S

## 2015-10-21 NOTE — Progress Notes (Signed)
Patient ID: Ashley [name], female    DOB: 1985/10/16, 30 y.o.   MRN: [id number]  PCP: Reginia Forts, MD  Subjective:   Chief Complaint  Patient presents with  . Vaginitis    HPI Presents for evaluation of 1 day of vaginal discharge. She describes irritation, malodor and swelling of the external genitalia.  No urinary symptoms. No fever, chills.  She had a dose of fluconazole leftover from a previous prescription which she took yesterday.  2 episodes of BV in the past 3 months. Treated with metronidazole on both occasions. Completed second course about 2 weeks ago. Recurrent vaginal yeast infections following the birth of her second child. Takes probiotics and avoids foods that seem to trigger recurrence, like pineapple. She is interested in boric acid suppositories to normalize vaginal flora.  Monogamous sex with 1 female partner, her husband. History of HSV. S/P BTL.    Review of Systems As above.    Patient Active Problem List   Diagnosis Date Noted  . S/P tubal ligation 03/28/2013     Prior to Admission medications   Medication Sig Start Date End Date Taking? Authorizing Provider  celecoxib (CELEBREX) 200 MG capsule Take 2 capsules by mouth.  You may take 1 additional capsule by mouth. 08/01/15  Yes Dorian Heckle English, PA  cetirizine-pseudoephedrine (ZYRTEC-D ALLERGY & CONGESTION) 5-120 MG tablet Take 1 tablet by mouth 2 (two) times daily. 09/13/15  Yes Tereasa Coop, PA-C  dicyclomine (BENTYL) 10 MG capsule Take 1 capsule (10 mg total) by mouth 3 (three) times daily as needed for spasms. 02/28/15  Yes Robyn Haber, MD  valACYclovir (VALTREX) 500 MG tablet Take 1 tablet (500 mg total) by mouth 2 (two) times daily. Take for 3 days with outbreaks. 11/20/14  Yes Tishira R Brewington, PA-C  fluconazole (DIFLUCAN) 150 MG tablet  10/20/15   Historical Provider, MD     No Known Allergies     Objective:  Physical Exam  Constitutional: She is oriented to person, place,  and time. She appears well-developed and well-nourished. She is active and cooperative. No distress.  BP 112/74 mmHg  Pulse 81  Temp(Src) 98.4 F (36.9 C) (Oral)  Resp 17  Ht 5\' 6"  (1.676 m)  Wt 155 lb (70.308 kg)  BMI 25.03 kg/m2  SpO2 100%  LMP 10/12/2015   Eyes: Conjunctivae are normal.  Pulmonary/Chest: Effort normal.  Abdominal: Hernia confirmed negative in the right inguinal area and confirmed negative in the left inguinal area.  Genitourinary: Uterus normal. No labial fusion. There is no rash, tenderness, lesion or injury on the right labia. There is no rash, tenderness, lesion or injury on the left labia. Cervix exhibits no motion tenderness, no discharge and no friability. Right adnexum displays no mass, no tenderness and no fullness. Left adnexum displays no mass, no tenderness and no fullness. No erythema, tenderness or bleeding in the vagina. No foreign body around the vagina. No signs of injury around the vagina. Vaginal discharge found.  Mild vulvar swelling.  Lymphadenopathy:       Right: No inguinal adenopathy present.       Left: No inguinal adenopathy present.  Neurological: She is alert and oriented to person, place, and time.  Psychiatric: She has a normal mood and affect. Her speech is normal and behavior is normal.       Results for orders placed or performed in visit on 10/21/15  POCT Wet + KOH Prep  Result Value Ref Range   Yeast by KOH Present  Present, Absent   Yeast by wet prep Present Present, Absent   WBC by wet prep Moderate (A) None, Few, Too numerous to count   Clue Cells Wet Prep HPF POC Few (A) None, Too numerous to count   Trich by wet prep Absent Present, Absent   Bacteria Wet Prep HPF POC Many (A) None, Few, Too numerous to count   Epithelial Cells By [employer] ([employer]) Many (A) None, Few, Too numerous to count   RBC,UR,HPF,POC Few (A) None RBC/hpf       Assessment & Plan:   1. Vaginal discharge Yeast vaginitis, recurrent. She took diflucan  yesterday, so recommend holding the new Rx for another 5-7 days, In the meantime, start BIW boric acid suppositories. - POCT Wet + KOH Prep - AMBULATORY NON FORMULARY MEDICATION; Boric Acid Suppository 600 mg Insert 1 PV twice weekly to maintain vaginal flora  Dispense: 10 suppository; Refill: 5 - fluconazole (DIFLUCAN) 150 MG tablet; Take 1 tablet (150 mg total) by mouth once. Repeat if needed  Dispense: 2 tablet; Refill: 0   Fara Chute, PA-C Physician Assistant-Certified [facility]

## 2015-10-24 ENCOUNTER — Telehealth: Payer: Self-pay

## 2015-10-24 NOTE — Telephone Encounter (Signed)
Pt brought in paperwork concerning her TB test done here.  I placed in nurse's box at 102 to be completed. Please advise when ready. [phone]

## 2015-10-25 ENCOUNTER — Encounter: Payer: Self-pay | Admitting: Internal Medicine

## 2015-10-25 NOTE — Telephone Encounter (Signed)
Left message for pt to call back  °

## 2016-01-04 ENCOUNTER — Other Ambulatory Visit: Payer: Self-pay | Admitting: *Deleted

## 2016-01-04 DIAGNOSIS — A6000 Herpesviral infection of urogenital system, unspecified: Secondary | ICD-10-CM

## 2016-01-04 MED ORDER — VALACYCLOVIR HCL 500 MG PO TABS: 500.0000 mg | ORAL_TABLET | Freq: Two times a day (BID) | ORAL | 1 refills | Status: DC

## 2016-09-07 ENCOUNTER — Other Ambulatory Visit: Payer: Self-pay | Admitting: Physician Assistant

## 2016-09-07 DIAGNOSIS — Z30011 Encounter for initial prescription of contraceptive pills: Secondary | ICD-10-CM

## 2016-09-29 ENCOUNTER — Other Ambulatory Visit: Payer: Self-pay | Admitting: Physician Assistant

## 2016-09-29 DIAGNOSIS — Z30011 Encounter for initial prescription of contraceptive pills: Secondary | ICD-10-CM

## 2016-09-30 ENCOUNTER — Telehealth: Payer: Self-pay | Admitting: Family Medicine

## 2016-09-30 NOTE — Telephone Encounter (Signed)
Pt is needing to talk with someone about her birth control prescription   Best number [phone]

## 2016-10-01 NOTE — Telephone Encounter (Signed)
#   is out of service

## 2016-10-29 ENCOUNTER — Other Ambulatory Visit: Payer: Self-pay | Admitting: Physician Assistant

## 2016-11-01 ENCOUNTER — Other Ambulatory Visit: Payer: Self-pay | Admitting: Physician Assistant

## 2016-11-01 DIAGNOSIS — A6000 Herpesviral infection of urogenital system, unspecified: Secondary | ICD-10-CM

## 2016-11-01 NOTE — Telephone Encounter (Signed)
Call via answering service. Unable to reach this patient by phone. Requesting refill of valacyclovir for "outbreak."  Meds ordered this encounter  Medications  . valACYclovir (VALTREX) 500 MG tablet    Sig: TAKE 1 TABLET BY MOUTH TWICE DAILY. TAKE FOR 3 DAYS WITH OUTBREAKS    Dispense:  30 tablet    Refill:  0   Patient notified via My Chart. Needs OV. Last seen in our office 10/2015. Note recent visit at [facility] for sore throat.
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
## 2017-02-10 ENCOUNTER — Telehealth: Payer: Self-pay | Admitting: Family Medicine

## 2017-02-10 DIAGNOSIS — A6000 Herpesviral infection of urogenital system, unspecified: Secondary | ICD-10-CM

## 2017-02-10 NOTE — Telephone Encounter (Signed)
Tried to call the patient from the number in the chart, phone has been disconnected. Pt has not been seen here in over a year. Pt will need to sche an appt.

## 2017-02-22 ENCOUNTER — Other Ambulatory Visit: Payer: Self-pay | Admitting: Physician Assistant

## 2017-02-22 DIAGNOSIS — A6000 Herpesviral infection of urogenital system, unspecified: Secondary | ICD-10-CM

## 2017-02-22 MED ORDER — VALACYCLOVIR HCL 500 MG PO TABS: ORAL_TABLET | ORAL | 0 refills | Status: DC

## 2017-02-22 NOTE — Telephone Encounter (Signed)
Meds ordered this encounter  Medications  . valACYclovir (VALTREX) 500 MG tablet    Sig: TAKE 1 TABLET BY MOUTH TWICE DAILY. TAKE FOR 3 DAYS WITH OUTBREAKS    Dispense:  30 tablet    Refill:  0    Order Specific Question:   Supervising Provider    Answer:   Brigitte Pulse, EVA N [[id number]]

## 2017-02-22 NOTE — Telephone Encounter (Signed)
Pt called again about this.  Her original message was 02-10-17 She updated her phone number. [phone]

## 2017-02-22 NOTE — Telephone Encounter (Signed)
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

## 2017-02-22 NOTE — Telephone Encounter (Signed)
Pt calling for refill on Valtrex. Stated, that she is currently having an outbreak and is not able to come in for an office visit for a couple of week. Please advise.

## 2017-07-27 ENCOUNTER — Other Ambulatory Visit: Payer: Self-pay

## 2017-07-27 ENCOUNTER — Encounter: Payer: Self-pay | Admitting: Physician Assistant

## 2017-07-27 ENCOUNTER — Ambulatory Visit: Payer: BC Managed Care – PPO | Admitting: Physician Assistant

## 2017-07-27 VITALS — BP 102/76 | HR 72 | Temp 98.6°F | Resp 18 | Ht 66.0 in | Wt 186.6 lb

## 2017-07-27 DIAGNOSIS — A6000 Herpesviral infection of urogenital system, unspecified: Secondary | ICD-10-CM

## 2017-07-27 DIAGNOSIS — Z79899 Other long term (current) drug therapy: Secondary | ICD-10-CM | POA: Diagnosis not present

## 2017-07-27 MED ORDER — VALACYCLOVIR HCL 500 MG PO TABS
ORAL_TABLET | ORAL | 11 refills | Status: DC
Start: 2017-07-27 — End: 2018-08-17

## 2017-07-27 NOTE — Progress Notes (Signed)
   Ashley [name]  MRN: [id number] DOB: 1985/07/06  PCP: Wardell Honour, MD  Subjective:  Pt is a 32 year old female presents to clinic for medication refill of Valtrex.  H/o genital herpes diagnosed in 2008.  She was on suppression therapy when she was first diagnosed.  Has been on intermittent treatment with Valtrex.  Recently she has had one outbreak per month for the past several months.  She endorses increased life stressors.  Denies medication side effects. She is married.  Has intercourse with one partner only.  She is not concerned with STD testing today  Review of Systems  Constitutional: Negative for chills, diaphoresis, fatigue and fever.  Genitourinary: Positive for genital sores. Negative for pelvic pain, urgency, vaginal bleeding, vaginal discharge and vaginal pain.    Patient Active Problem List   Diagnosis Date Noted  . S/P tubal ligation 03/28/2013    Current Outpatient Medications on File Prior to Visit  Medication Sig Dispense Refill  . AMBULATORY NON FORMULARY MEDICATION Boric Acid Suppository 600 mg Insert 1 PV twice weekly to maintain vaginal flora 10 suppository 5  . Boric Acid POWD INSERT ONE CAPSULE (600MG )  INTO THE VAGINA TWICE A WEEK TO MAINTAIN VAGINAL FLORA 0.6 g 0  . celecoxib (CELEBREX) 200 MG capsule Take 2 capsules by mouth.  You may take 1 additional capsule by mouth. 30 capsule 0  . cetirizine-pseudoephedrine (ZYRTEC-D ALLERGY & CONGESTION) 5-120 MG tablet Take 1 tablet by mouth 2 (two) times daily. 14 tablet 0  . dicyclomine (BENTYL) 10 MG capsule Take 1 capsule (10 mg total) by mouth 3 (three) times daily as needed for spasms. 15 capsule 1  . fluconazole (DIFLUCAN) 150 MG tablet Take 1 tablet (150 mg total) by mouth once. Repeat if needed 2 tablet 0  . QUASENSE 0.15-0.03 MG tablet TAKE 1 TABLET BY MOUTH DAILY 91 tablet 0  . valACYclovir (VALTREX) 500 MG tablet TAKE 1 TABLET BY MOUTH TWICE DAILY. TAKE FOR 3 DAYS WITH OUTBREAKS 30 tablet 0   No current  facility-administered medications on file prior to visit.     No Known Allergies   Objective:  BP 102/76 (BP Location: Left Arm, Patient Position: Sitting, Cuff Size: Normal)   Pulse 72   Temp 98.6 F (37 C) (Oral)   Resp 18   Ht 5\' 6"  (1.676 m)   Wt 186 lb 9.6 oz (84.6 kg)   LMP 07/22/2017   SpO2 100%   BMI 30.12 kg/m   Physical Exam  Constitutional: She is oriented to person, place, and time and well-developed, well-nourished, and in no distress. No distress.  Cardiovascular: Normal rate, regular rhythm and normal heart sounds.  Neurological: She is alert and oriented to person, place, and time. GCS score is 15.  Skin: Skin is warm and dry.  Psychiatric: Mood, memory, affect and judgment normal.  Vitals reviewed.   Assessment and Plan :  1. Genital herpes simplex, unspecified site 2. Encounter for medication management - valACYclovir (VALTREX) 500 MG tablet; TAKE 1 TABLET BY MOUTH DAILY.  Dispense: 30 tablet; Refill: 11 -Plan to start patient on suppressive therapy, as she has been having outbreaks every month for the past several months.  Discussed with patient I would like to recheck in 6-12 months.  Discussed stress relieving techniques.   Mercer Pod, PA-C  [facility] [place] 07/27/2017 12:08 PM

## 2017-07-27 NOTE — Patient Instructions (Addendum)
  Start taking Valacyclovir suppressive therapy (eg, for frequent recurrences): Oral: take 500 mg or 1 g once daily.  Come back and see me in 6-12 months for recheck.    NUTRITION Eat more plants: colorful vegetables, nuts, seeds and berries. Eat less sugar, salt, preservatives and processed foods. Avoid toxins such as cigarettes and alcohol. Drink water when you are thirsty. Warm water with a slice of lemon is an excellent morning drink to start the day.  Consider these websites for more information The Nutrition Source (https://www.henry-hernandez.biz/) Precision Nutrition (WindowBlog.ch)   RELAXATION Consider practicing mindfulness meditation or other relaxation techniques such as deep breathing, prayer, yoga, tai chi, massage. See website mindful.org or the apps Headspace or Calm to help get started.   SLEEP Try to get at least 7-8+ hours sleep per day. Regular exercise and reduced caffeine will help you sleep better. Practice good sleep hygeine techniques. See website sleep.org for more information.  Thank you for coming in today. I hope you feel we met your needs.  Feel free to call PCP if you have any questions or further requests.  Please consider signing up for MyChart if you do not already have it, as this is a great way to communicate with me.  Best,  Whitney McVey, PA-C   IF you received an x-ray today, you will receive an invoice from [facility]. Please contact [facility] at [phone] with questions or concerns regarding your invoice.   IF you received labwork today, you will receive an invoice from [facility]. Please contact [facility] at [phone] with questions or concerns regarding your invoice.   Our billing staff will not be able to assist you with questions regarding bills from these companies.  You will be contacted with the lab results as soon as they are available. The fastest way to  get your results is to activate your My Chart account. Instructions are located on the last page of this paperwork. If you have not heard from [place] regarding the results in 2 weeks, please contact this office.

## 2017-08-18 ENCOUNTER — Encounter: Payer: Self-pay | Admitting: Physician Assistant

## 2017-09-24 ENCOUNTER — Encounter: Payer: Self-pay | Admitting: Family Medicine

## 2017-09-29 ENCOUNTER — Encounter: Payer: Self-pay | Admitting: Family Medicine

## 2018-02-07 ENCOUNTER — Telehealth: Payer: Self-pay | Admitting: Family Medicine

## 2018-02-07 NOTE — Telephone Encounter (Signed)
Please advise 

## 2018-02-07 NOTE — Telephone Encounter (Signed)
Copied from [place] [phone]. Topic: General - Other >> Feb 07, 2018 12:34 PM Janace Aris A wrote: Reason for CRM: Patient called in requesting a letter regarding her symptoms of Scoliosis, says she is unable to sit or stand for long periods of time. She needs this letter for work and travel. Said she can come pick up letter.

## 2018-02-09 NOTE — Telephone Encounter (Signed)
Pt wants to know if her note is done and when she can come and pick that up.

## 2018-02-10 NOTE — Telephone Encounter (Signed)
Advised patient she would need to be seen.   Patient voiced understanding stated she would call back to schedule appointment

## 2018-02-10 NOTE — Telephone Encounter (Signed)
Patient called back to see what that status of a letter requested a few days ago was. She stated that she is really in need of this letter for employment purposes. Request a call back ASAP please. Ph# [phone]

## 2018-02-16 ENCOUNTER — Encounter: Payer: Self-pay | Admitting: Physician Assistant

## 2018-02-16 ENCOUNTER — Other Ambulatory Visit: Payer: Self-pay

## 2018-02-16 ENCOUNTER — Ambulatory Visit: Payer: BC Managed Care – PPO | Admitting: Physician Assistant

## 2018-02-16 VITALS — BP 117/74 | HR 57 | Temp 98.4°F | Resp 18 | Ht 66.0 in | Wt 157.4 lb

## 2018-02-16 DIAGNOSIS — M419 Scoliosis, unspecified: Secondary | ICD-10-CM | POA: Diagnosis not present

## 2018-02-16 NOTE — Progress Notes (Signed)
Ashley [name]  MRN: [id number] DOB: [birth year]  PCP: Wardell Honour, MD  Subjective:  Pt is a pleasant 32 year old female who presents to clinic for f/u back pain.  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 Needs a note allowing her to sot or stand as needed during her workday to avoid back pain.  Diagnosed with scoliosis about 6 years ago. She has seen spine and scoliosis a few years ago. She does regular stretches and PT exercises at home.   Lumbar spine x-ray 03/09/2012: Clinical Data: Low back pain.  LUMBAR SPINE - 2-3 VIEW  Comparison: None.  Findings: Five non-rib bearing lumbar type vertebral bodies are present.  The vertebral body heights and alignment are normal. Leftward curvature of the lumbar spine is centered at L2-3.  No acute bone or soft tissue abnormality is present.  IMPRESSION: Leftward curvature of the lumbar spine.  Review of Systems  Gastrointestinal: Negative for abdominal pain.  Genitourinary: Negative for dysuria, flank pain, frequency and urgency.  Musculoskeletal: Positive for back pain. Negative for gait problem.    Patient Active Problem List   Diagnosis Date Noted  . S/P tubal ligation 03/28/2013    Current Outpatient Medications on File Prior to Visit  Medication Sig Dispense Refill  . AMBULATORY NON FORMULARY MEDICATION Boric Acid Suppository 600 mg Insert 1 PV twice weekly to maintain vaginal flora 10 suppository 5  . Boric Acid POWD INSERT ONE CAPSULE (600MG )  INTO THE VAGINA TWICE A WEEK TO MAINTAIN VAGINAL FLORA 0.6 g 0  . celecoxib (CELEBREX) 200 MG capsule Take 2 capsules by mouth.  You may take 1 additional capsule by mouth. 30 capsule 0  . cetirizine-pseudoephedrine (ZYRTEC-D ALLERGY & CONGESTION) 5-120 MG tablet Take 1 tablet by mouth 2 (two) times daily. 14 tablet 0  . dicyclomine (BENTYL) 10 MG capsule Take 1 capsule (10 mg total) by mouth 3 (three) times daily as needed for spasms. 15 capsule 1  . fluconazole (DIFLUCAN) 150 MG  tablet Take 1 tablet (150 mg total) by mouth once. Repeat if needed 2 tablet 0  . QUASENSE 0.15-0.03 MG tablet TAKE 1 TABLET BY MOUTH DAILY 91 tablet 0  . valACYclovir (VALTREX) 500 MG tablet TAKE 1 TABLET BY MOUTH DAILY. 30 tablet 11   No current facility-administered medications on file prior to visit.     No Known Allergies   Objective:  BP 117/74   Pulse (!) 57   Temp 98.4 F (36.9 C) (Oral)   Resp 18   Ht 5\' 6"  (1.676 m)   Wt 157 lb 6.4 oz (71.4 kg)   LMP 01/20/2018   SpO2 100%   BMI 25.41 kg/m   Physical Exam  Constitutional: She is oriented to person, place, and time. No distress.  Musculoskeletal:       Thoracic back: She exhibits bony tenderness. She exhibits normal range of motion and no tenderness.  Leftward curvature of lumbar spine seen with full forward flexion from the hips.   Neurological: She is alert and oriented to person, place, and time.  Skin: Skin is warm and dry.  Psychiatric: Judgment normal.  Vitals reviewed.  Assessment and Plan :  1. Scoliosis of lumbar spine, unspecified scoliosis type - Note written for work advising of PMH and asking to allow Ms. Lorentz to stand or sit as needed. Advised pt to f/u with spine if her symptoms worsen. She understands and agrees.    Mercer Pod, PA-C  [facility]  [facility] 02/16/2018 2:12 PM  Please note: Portions of this report may have been transcribed using dragon voice recognition software. Every effort was made to ensure accuracy; however, inadvertent computerized transcription errors may be present.

## 2018-02-16 NOTE — Patient Instructions (Signed)
° ° ° °  If you have lab work done today you will be contacted with your lab results within the next 2 weeks.  If you have not heard from us then please contact us. The fastest way to get your results is to register for My Chart. ° ° °IF you received an x-ray today, you will receive an invoice from [facility]. Please contact [facility] at [phone] with questions or concerns regarding your invoice.  ° °IF you received labwork today, you will receive an invoice from [facility]. Please contact [facility] at [phone] with questions or concerns regarding your invoice.  ° °Our billing staff will not be able to assist you with questions regarding bills from these companies. ° °You will be contacted with the lab results as soon as they are available. The fastest way to get your results is to activate your My Chart account. Instructions are located on the last page of this paperwork. If you have not heard from us regarding the results in 2 weeks, please contact this office. °  ° ° ° °

## 2018-04-04 ENCOUNTER — Encounter: Payer: Self-pay | Admitting: Physician Assistant

## 2018-04-04 ENCOUNTER — Other Ambulatory Visit: Payer: Self-pay

## 2018-04-04 ENCOUNTER — Ambulatory Visit: Payer: BC Managed Care – PPO | Admitting: Physician Assistant

## 2018-04-04 VITALS — BP 125/84 | HR 65 | Temp 98.7°F | Resp 16 | Ht 66.0 in | Wt 157.0 lb

## 2018-04-04 DIAGNOSIS — Z113 Encounter for screening for infections with a predominantly sexual mode of transmission: Secondary | ICD-10-CM

## 2018-04-04 DIAGNOSIS — R82998 Other abnormal findings in urine: Secondary | ICD-10-CM | POA: Diagnosis not present

## 2018-04-04 DIAGNOSIS — N760 Acute vaginitis: Secondary | ICD-10-CM

## 2018-04-04 DIAGNOSIS — B9689 Other specified bacterial agents as the cause of diseases classified elsewhere: Secondary | ICD-10-CM

## 2018-04-04 DIAGNOSIS — R1032 Left lower quadrant pain: Secondary | ICD-10-CM

## 2018-04-04 LAB — POCT CBC
Granulocyte percent: 68.3 % (ref 37–80)
HCT, POC: 37.5 % (ref 29–41)
Hemoglobin: 12.5 g/dL (ref 9.5–13.5)
Lymph, poc: 1.6 (ref 0.6–3.4)
MCH, POC: 27.4 pg (ref 27–31.2)
MCHC: 33.3 g/dL (ref 31.8–35.4)
MCV: 82.5 fL (ref 76–111)
MID (cbc): 0.2 (ref 0–0.9)
MPV: 7.3 fL (ref 0–99.8)
POC Granulocyte: 3.9 (ref 2–6.9)
POC LYMPH PERCENT: 28.5 % (ref 10–50)
POC MID %: 3.2 % (ref 0–12)
Platelet Count, POC: 315 10*3/uL (ref 142–424)
RBC: 4.55 M/uL (ref 4.04–5.48)
RDW, POC: 14.3 %
WBC: 5.7 10*3/uL (ref 4.6–10.2)

## 2018-04-04 LAB — POCT URINALYSIS DIP (MANUAL ENTRY)
Bilirubin, UA: NEGATIVE
Glucose, UA: NEGATIVE mg/dL
Ketones, POC UA: NEGATIVE mg/dL
Nitrite, UA: NEGATIVE
Protein Ur, POC: NEGATIVE mg/dL
Spec Grav, UA: 1.02 (ref 1.010–1.025)
Urobilinogen, UA: 0.2 E.U./dL
pH, UA: 6 (ref 5.0–8.0)

## 2018-04-04 LAB — POC MICROSCOPIC URINALYSIS (UMFC): Mucus: ABSENT

## 2018-04-04 LAB — POCT WET + KOH PREP
Trich by wet prep: ABSENT
Yeast by KOH: ABSENT
Yeast by wet prep: ABSENT

## 2018-04-04 LAB — POCT URINE PREGNANCY: Preg Test, Ur: NEGATIVE

## 2018-04-04 MED ORDER — METRONIDAZOLE 500 MG PO TABS: 500.0000 mg | ORAL_TABLET | Freq: Two times a day (BID) | ORAL | 0 refills | Status: DC

## 2018-04-04 NOTE — Patient Instructions (Addendum)
Start taking Flagyl for BV. Take this twice daily x 7 days - don't drink alcohol while taking this.   I am concerned for possible ovarian cyst. You will receive a phone call in 48 hours to schedule an appointment with [place] for transvaginal US.   We will call and let you know if you need to take a different antibiotic for possible UTI ( low likelihood)   If you get worse, go to the ED.   If you have lab work done today you will be contacted with your lab results within the next 2 weeks.  If you have not heard from [place] then please contact us. The fastest way to get your results is to register for My Chart.  Thank you for coming in today. I hope you feel we met your needs.  Feel free to call PCP if you have any questions or further requests.  Please consider signing up for MyChart if you do not already have it, as this is a great way to communicate with me.  Best,  Whitney McVey, PA-C  IF you received an x-ray today, you will receive an invoice from [facility]. Please contact [facility] at [phone] with questions or concerns regarding your invoice.   IF you received labwork today, you will receive an invoice from [place]. Please contact [facility] at [phone] with questions or concerns regarding your invoice.   Our billing staff will not be able to assist you with questions regarding bills from these companies.  You will be contacted with the lab results as soon as they are available. The fastest way to get your results is to activate your My Chart account. Instructions are located on the last page of this paperwork. If you have not heard from [place] regarding the results in 2 weeks, please contact this office.

## 2018-04-04 NOTE — Progress Notes (Signed)
Ashley [name]  MRN: [id number] DOB: 01/20/1986  PCP: Patient, No Pcp Per  Subjective:  Pt is a pleasant 32 year old female who presents to clinic for LLQ abdominal pain x 3 days.  Hurts to sit down Feels better laying down or standing.  LLQ "sharp stabbing pain". Pain is about 7/10. Pain can get down to 2/10. LMP 11/21/ she is currently on the last day of her period. No changes with period.  Home pregnancy test negative.  Has been taking ibuprofen for pain. Intercourse with same female partner, not concerned for STDs today, however is amicable to checking.   Denies vaginal discharge, urinary symptoms, fever, chills, n/v, changes with BM, constipation She still has appendix.  No h/o diverticulitis.  She had ovarian cysts 2014.  She goes to [employer].    Review of Systems  Constitutional: Negative for chills and fever.  Gastrointestinal: Positive for abdominal pain. Negative for blood in stool, diarrhea, nausea and vomiting.  Genitourinary: Negative for dysuria, flank pain, frequency, hematuria, menstrual problem, pelvic pain, urgency, vaginal bleeding, vaginal discharge and vaginal pain.  Musculoskeletal: Negative for back pain.    Patient Active Problem List   Diagnosis Date Noted  . Scoliosis of lumbar spine 02/16/2018  . S/P tubal ligation 03/28/2013    Current Outpatient Medications on File Prior to Visit  Medication Sig Dispense Refill  . valACYclovir (VALTREX) 500 MG tablet TAKE 1 TABLET BY MOUTH DAILY. 30 tablet 11  . AMBULATORY NON FORMULARY MEDICATION Boric Acid Suppository 600 mg Insert 1 PV twice weekly to maintain vaginal flora (Patient not taking: Reported on 04/04/2018) 10 suppository 5  . Boric Acid POWD INSERT ONE CAPSULE (600MG )  INTO THE VAGINA TWICE A WEEK TO MAINTAIN VAGINAL FLORA (Patient not taking: Reported on 04/04/2018) 0.6 g 0  . celecoxib (CELEBREX) 200 MG capsule Take 2 capsules by mouth.  You may take 1 additional capsule by mouth. (Patient not taking:  Reported on 04/04/2018) 30 capsule 0  . cetirizine-pseudoephedrine (ZYRTEC-D ALLERGY & CONGESTION) 5-120 MG tablet Take 1 tablet by mouth 2 (two) times daily. (Patient not taking: Reported on 04/04/2018) 14 tablet 0  . dicyclomine (BENTYL) 10 MG capsule Take 1 capsule (10 mg total) by mouth 3 (three) times daily as needed for spasms. (Patient not taking: Reported on 04/04/2018) 15 capsule 1  . fluconazole (DIFLUCAN) 150 MG tablet Take 1 tablet (150 mg total) by mouth once. Repeat if needed (Patient not taking: Reported on 04/04/2018) 2 tablet 0  . QUASENSE 0.15-0.03 MG tablet TAKE 1 TABLET BY MOUTH DAILY (Patient not taking: Reported on 04/04/2018) 91 tablet 0   No current facility-administered medications on file prior to visit.     No Known Allergies   Objective:  BP 125/84   Pulse 65   Temp 98.7 F (37.1 C) (Oral)   Resp 16   Ht 5\' 6"  (1.676 m)   Wt 157 lb (71.2 kg)   LMP 03/31/2018 (Approximate)   SpO2 99%   BMI 25.34 kg/m   Physical Exam  Constitutional: She appears well-developed and well-nourished. No distress.  Abdominal: Soft. Normal appearance and bowel sounds are normal. There is tenderness in the left lower quadrant. There is no rigidity, no rebound and no guarding.  Genitourinary: Vagina normal and uterus normal. Cervix exhibits no motion tenderness and no discharge. Right adnexum displays no mass, no tenderness and no fullness. Left adnexum displays tenderness (mild). Left adnexum displays no mass and no fullness.  Genitourinary Comments: Menstrual blood  present  Skin: She is not diaphoretic.  Psychiatric: She has a normal mood and affect. Thought content normal.  Vitals reviewed.   Results for orders placed or performed in visit on 04/04/18  POCT CBC  Result Value Ref Range   WBC 5.7 4.6 - 10.2 K/uL   Lymph, poc 1.6 0.6 - 3.4   POC LYMPH PERCENT 28.5 10 - 50 %L   MID (cbc) 0.2 0 - 0.9   POC MID % 3.2 0 - 12 %M   POC Granulocyte 3.9 2 - 6.9   Granulocyte  percent 68.3 37 - 80 %G   RBC 4.55 4.04 - 5.48 M/uL   Hemoglobin 12.5 9.5 - 13.5 g/dL   HCT, POC 37.5 29 - 41 %   MCV 82.5 76 - 111 fL   MCH, POC 27.4 27 - 31.2 pg   MCHC 33.3 31.8 - 35.4 g/dL   RDW, POC 14.3 %   Platelet Count, POC 315 142 - 424 K/uL   MPV 7.3 0 - 99.8 fL  POCT urinalysis dipstick  Result Value Ref Range   Color, UA yellow yellow   Clarity, UA clear clear   Glucose, UA negative negative mg/dL   Bilirubin, UA negative negative   Ketones, POC UA negative negative mg/dL   Spec Grav, UA 1.020 1.010 - 1.025   Blood, UA moderate (A) negative   pH, UA 6.0 5.0 - 8.0   Protein Ur, POC negative negative mg/dL   Urobilinogen, UA 0.2 0.2 or 1.0 E.U./dL   Nitrite, UA Negative Negative   Leukocytes, UA Trace (A) Negative  POCT Microscopic Urinalysis ([facility])  Result Value Ref Range   WBC,UR,HPF,POC Moderate (A) None WBC/hpf   RBC,UR,HPF,POC Few (A) None RBC/hpf   Bacteria None None, Too numerous to count   Mucus Absent Absent   Epithelial Cells, UR Per Microscopy Moderate (A) None, Too numerous to count cells/hpf  POCT Wet + KOH Prep  Result Value Ref Range   Yeast by KOH Absent Absent   Yeast by wet prep Absent Absent   WBC by wet prep Few Few   Clue Cells Wet Prep HPF POC Moderate (A) None   Trich by wet prep Absent Absent   Bacteria Wet Prep HPF POC Few Few   Epithelial Cells By [employer] Pref ([facility]) Moderate (A) None, Few, Too numerous to count   RBC,UR,HPF,POC Few (A) None RBC/hpf  POCT urine pregnancy  Result Value Ref Range   Preg Test, Ur Negative Negative    Assessment and Plan :  1. LLQ pain - Pt presents c/o intermittent 7/10 LLQ pain x 3 days. Vitals are stable. WBC count WNL. PE is not concerning for abscess, appendicitis, PID, ectopic. HCG negative. Plan to refer to GYN for transvaginal US. Concern for possible ovarian cyst. Wet prep did show moderate clue cells, will treat with flagyl, however I do not believe this is causing her symptoms. Advised pt to  present to the ED if symptoms worsen. She understands and agrees with plan.   - POCT CBC - POCT urinalysis dipstick - POCT Microscopic Urinalysis ([facility]) - POCT Wet + KOH Prep - POCT urine pregnancy - Ambulatory referral to Gynecology  2. Screen for STD (sexually transmitted disease) - Chlamydia/Gonococcus/Trichomonas, NAA  3. Urine leukocytes - Urine Culture  4. BV (bacterial vaginosis) - metroNIDAZOLE (FLAGYL) 500 MG tablet; Take 1 tablet (500 mg total) by mouth 2 (two) times daily with a meal. DO NOT CONSUME ALCOHOL WHILE TAKING THIS MEDICATION.  Dispense: 14 tablet; Refill: 0   Mercer Pod, PA-C  [facility] [place] 04/04/2018 5:46 PM  Please note: Portions of this report may have been transcribed using dragon voice recognition software. Every effort was made to ensure accuracy; however, inadvertent computerized transcription errors may be present.

## 2018-04-06 LAB — CHLAMYDIA/GONOCOCCUS/TRICHOMONAS, NAA
Chlamydia by NAA: NEGATIVE
Gonococcus by NAA: NEGATIVE
Trich vag by NAA: NEGATIVE

## 2018-04-06 LAB — URINE CULTURE

## 2018-04-12 ENCOUNTER — Ambulatory Visit (INDEPENDENT_AMBULATORY_CARE_PROVIDER_SITE_OTHER): Payer: BC Managed Care – PPO

## 2018-04-12 ENCOUNTER — Other Ambulatory Visit: Payer: Self-pay | Admitting: Physician Assistant

## 2018-04-12 ENCOUNTER — Encounter: Payer: Self-pay | Admitting: Physician Assistant

## 2018-04-12 ENCOUNTER — Encounter: Payer: Self-pay | Admitting: Obstetrics & Gynecology

## 2018-04-12 ENCOUNTER — Ambulatory Visit: Payer: BC Managed Care – PPO | Admitting: Obstetrics & Gynecology

## 2018-04-12 ENCOUNTER — Other Ambulatory Visit: Payer: Self-pay | Admitting: Obstetrics & Gynecology

## 2018-04-12 VITALS — BP 116/70 | Ht 65.0 in | Wt 154.0 lb

## 2018-04-12 DIAGNOSIS — D251 Intramural leiomyoma of uterus: Secondary | ICD-10-CM

## 2018-04-12 DIAGNOSIS — N852 Hypertrophy of uterus: Secondary | ICD-10-CM

## 2018-04-12 DIAGNOSIS — R1032 Left lower quadrant pain: Secondary | ICD-10-CM | POA: Diagnosis not present

## 2018-04-12 DIAGNOSIS — N3000 Acute cystitis without hematuria: Secondary | ICD-10-CM

## 2018-04-12 MED ORDER — CEPHALEXIN 500 MG PO CAPS: 500.0000 mg | ORAL_CAPSULE | Freq: Two times a day (BID) | ORAL | 0 refills | Status: DC

## 2018-04-12 NOTE — Progress Notes (Signed)
Results released to mychart: Your urine culture grew the bacteria Klebsiella pneumoniae. I am sending an antibiotic to your pharmacy. Please take this only if you are still experiencing symptoms. If you are not still having symptoms, do not take the antibiotic.

## 2018-04-12 NOTE — Progress Notes (Addendum)
    Ashley [name] 06-15-1985 [id number]        32 y.o.  G2P2L2 Married.  S/P TL.  RP: LLQ pain x 1 week  HPI: Seen by Fam MD for a LLQ pain, similar to previous pains from Ovarian Cysts.  Menses regular every 24-28 days, normal flow.  No metrorrhagia.  Normal vaginal secretions.  No pain with IC.  The pain occurred during her LMP 03/31/2018.  Resolved now.  Per patient, U/A, STI screen negative with Fam MD.   OB History  Gravida Para Term Preterm AB Living  2 2 2     2   SAB TAB Ectopic Multiple Live Births          2    # Outcome Date GA Lbr Len/2nd Weight Sex Delivery Anes PTL Lv  2 Term 03/27/13 [redacted]w[redacted]d 09:40 / 00:22 6 lb 6.7 oz (2.91 kg) F Vag-Spont None  LIV     Birth Comments: none  1 Term 2012   7 lb 8 oz (3.402 kg) F Vag-Spont   LIV    Past medical history,surgical history, problem list, medications, allergies, family history and social history were all reviewed and documented in the EPIC chart.   Directed ROS with pertinent positives and negatives documented in the history of present illness/assessment and plan.  Exam:  Vitals:   04/12/18 1204  BP: 116/70  Weight: 154 lb (69.9 kg)  Height: 5\' 5"  (1.651 m)   General appearance:  Normal  Abdomen: Normal  Gynecologic exam: Vulva normal.  Bimanual exam: Anteverted uterus, normal volume, mobile, nontender.  No adnexal mass felt.  Right nontender.  Left mildly tender.  Pelvic US today: T/V images.  Anteverted enlarged uterus measuring 12.2 x 7.64 x 7.9 cm.  Intramural fibroids measuring 3.3 x 3.3 cm, 2.4 x 2.4 cm, 3.1 x 2.9 cm, 2.3 x 2.3 cm, 1.8 x 1.6 cm, 1.9 x 1.8 cm, 2.9 x 2.6 cm, 2.8 x 2.6 cm with a possible submucosal component.  Endometrial line normal at 15.6 mm.  Right ovary normal.  Left ovary with an echo free cyst with a thin septum measuring 2.7 x 2.2 x 2.9 cm.  Negative color flow Doppler.  Right cul-de-sac fluid measuring 2.6 x 0.9 x 2.4 cm.   Assessment/Plan:  32 y.o. G2P2002   1. LLQ pain Left lower quadrant  pain for about a week.  Currently resolved.  Will follow-up for a pelvic ultrasound to completely rule out left ovarian pathology.  Possible ruptured of a functional ovarian cyst.  Intestinal origin of pain less likely.   - US Transvaginal Non-OB   Counseling on above issues and coordination of care more than 50% of 30 minutes.  Princess Bruins MD, 12:15 PM 04/12/2018

## 2018-04-12 NOTE — Patient Instructions (Signed)
  1. LLQ pain Left lower quadrant pain for about a week.  Currently resolved.  Will follow-up for a pelvic ultrasound to completely rule out left ovarian pathology.  Possible ruptured of a functional ovarian cyst.  Intestinal origin of pain less likely. - US Transvaginal Non-OB; Future  Ashley [name], it was a pleasure meeting you today!

## 2018-06-02 ENCOUNTER — Encounter: Payer: Self-pay | Admitting: Physician Assistant

## 2018-06-09 ENCOUNTER — Telehealth: Payer: Self-pay

## 2018-06-09 NOTE — Telephone Encounter (Signed)
LVM for pt to call the office and schedule an appt with either [place] or [place]. When pt calls back, please call [place] and ask for Carolynne Edouard or Velna Hatchet and we will make her a sameday appt. Thank you!

## 2018-06-09 NOTE — Telephone Encounter (Signed)
Please reach out to pt to schedule an appointment for abdominal pain/birth control request.  Thank you!

## 2018-08-17 ENCOUNTER — Other Ambulatory Visit: Payer: Self-pay | Admitting: Physician Assistant

## 2018-08-17 DIAGNOSIS — A6000 Herpesviral infection of urogenital system, unspecified: Secondary | ICD-10-CM

## 2018-09-07 ENCOUNTER — Ambulatory Visit (INDEPENDENT_AMBULATORY_CARE_PROVIDER_SITE_OTHER): Payer: BC Managed Care – PPO

## 2018-09-07 ENCOUNTER — Ambulatory Visit: Payer: Self-pay | Admitting: *Deleted

## 2018-09-07 ENCOUNTER — Other Ambulatory Visit: Payer: Self-pay

## 2018-09-07 ENCOUNTER — Encounter (HOSPITAL_COMMUNITY): Payer: Self-pay

## 2018-09-07 ENCOUNTER — Ambulatory Visit (HOSPITAL_COMMUNITY)
Admission: EM | Admit: 2018-09-07 | Discharge: 2018-09-07 | Disposition: A | Discharge status: Home / Self Care | Payer: BC Managed Care – PPO | Attending: Family Medicine | Admitting: Family Medicine

## 2018-09-07 DIAGNOSIS — S29011A Strain of muscle and tendon of front wall of thorax, initial encounter: Secondary | ICD-10-CM

## 2018-09-07 DIAGNOSIS — R0789 Other chest pain: Secondary | ICD-10-CM | POA: Diagnosis not present

## 2018-09-07 MED ORDER — MELOXICAM 7.5 MG PO TABS: 7.5000 mg | ORAL_TABLET | Freq: Every day | ORAL | 0 refills | Status: DC

## 2018-09-07 NOTE — Telephone Encounter (Signed)
Pt called with complaints of left sided chest and tighteness that started 09/06/2018 at 1900; she says that the pain is constant and is worse when she lays down; she also says that her husband says that it feels different from the right side of her chest; the area is where the breast meets the ribs; the pt says that she had her normal work out with her trainer on 09/05/2018; recommendations made per nurse triage protocol; the pt verbalizes understanding and will proceed to the ED; pt previously seen by [facility], [place]; will route to office for notification. Reason for Disposition . Chest pain lasts > 5 minutes (Exceptions: chest pain occurring > 3 days ago and now asymptomatic; same as previously diagnosed heartburn and has accompanying sour taste in mouth)  Answer Assessment - Initial Assessment Questions 1. LOCATION: "Where does it hurt?"       Left chest where breast meets the ribs 2. RADIATION: "Does the pain go anywhere else?" (e.g., into neck, jaw, arms, back)     A little to the back 3. ONSET: "When did the chest pain begin?" (Minutes, hours or days)      09/06/2018 at 1900 4. PATTERN "Does the pain come and go, or has it been constant since it started?"  "Does it get worse with exertion?"     Constant; worse when laying down 5. DURATION: "How long does it last" (e.g., seconds, minutes, hours)     hours 6. SEVERITY: "How bad is the pain?"  (e.g., Scale 1-10; mild, moderate, or severe)    - MILD (1-3): doesn't interfere with normal activities     - MODERATE (4-7): interferes with normal activities or awakens from sleep    - SEVERE (8-10): excruciating pain, unable to do any normal activities       7-8 out of 10 when turning her body; 5 out of 10 when walking 7. CARDIAC RISK FACTORS: "Do you have any history of heart problems or risk factors for heart disease?" (e.g., prior heart attack, angina; high blood pressure, diabetes, being overweight, high cholesterol, smoking, or strong family  history of heart disease)     Mother and grandmother both hypertensive 21. PULMONARY RISK FACTORS: "Do you have any history of lung disease?"  (e.g., blood clots in lung, asthma, emphysema, birth control pills)    no 9. CAUSE: "What do you think is causing the chest pain?"     Don't know 10. OTHER SYMPTOMS: "Do you have any other symptoms?" (e.g., dizziness, nausea, vomiting, sweating, fever, difficulty breathing, cough)       no 11. PREGNANCY: "Is there any chance you are pregnant?" "When was your last menstrual period?"      No LMP 09/04/2018  Protocols used: CHEST PAIN-A-AH

## 2018-09-07 NOTE — Discharge Instructions (Signed)
Chest x-ray did not show fracture or dislocation, just scoliosis of the thoracic spine. EKG did not show concerning features, although this does not rule out cardiac cause Symptoms most likely musculoskeletal in nature Rest, ice, heat, and gentle stretching Mobic prescribed.  Take as directed for pain and inflammation. Avoid taking with other antiinflammatories as this may cause GI upset and/or bleed Follow up with PCP in 1-2 weeks to ensure symptoms are improving Return or go to the ED if you have any new or worsening symptoms such as fever, chills, nausea, vomiting, worsening chest pain, shortness of breath, weakness, numbness or tingling, etc..Marland Kitchen

## 2018-09-07 NOTE — ED Provider Notes (Signed)
[facility]   [id number] 09/07/18 Arrival Time: 7939   CC: CHEST tightness and discomfort.  SUBJECTIVE:  Ashley [name] is a 33 y.o. female who presents with complaint of abrupt chest tightness and discomfort that began last night.  Denies a precipitating event, trauma, recent lower respiratory tract, or strenuous upper body activities. Does admit to working out, but nothing out of the routine. Localizes chest tightness and discomfort to the left chest.   Describes as improving, that is constant and nagging in character.  Rates pain as 7/10.   Has tried ibuprofen with relief.  Symptoms made worse with laying down, turning towards her right side, and laughing.  Denies radiating symptoms.  Denies previous symptoms in the past.  Denies fever, chills, lightheadedness, dizziness, palpitations, tachycardia, SOB, nausea, vomiting, abdominal pain, changes in bowel or bladder habits, diaphoresis, numbness/tingling in extremities, peripheral edema, or anxiety.    Denies SOB, calf pain or swelling, recent long travel, recent surgery, pregnancy, malignancy, tobacco use, hormone use, or previous blood clot  Grandmother and mother with HTN.  Great grandmother with MI in 22's.    ROS: As per HPI. Past Medical History:  Diagnosis Date  . Allergy    Zyrtec daily.  . Chlamydia 05/11/2008  . Genital herpes    HSV II.  . Migraine   . Normal pregnancy, repeat 03/27/2013  . S/P tubal ligation 03/28/2013  . Scoliosis   . SGA (small for gestational age), fetal, affecting care of mother, antepartum 03/27/2013  . SVD (spontaneous vaginal delivery) 03/27/2013  . SVD (spontaneous vaginal delivery) 03/27/2013   Past Surgical History:  Procedure Laterality Date  . MYRINGOTOMY WITH TUBE PLACEMENT    . TUBAL LIGATION Bilateral 03/28/2013   Procedure: POST PARTUM TUBAL LIGATION;  Surgeon: Thornell Sartorius, MD;  Location: [facility];  Service: Gynecology;  Laterality: Bilateral;   No Known Allergies No current  facility-administered medications on file prior to encounter.    Current Outpatient Medications on File Prior to Encounter  Medication Sig Dispense Refill  . valACYclovir (VALTREX) 500 MG tablet TAKE 1 TABLET BY MOUTH DAILY 30 tablet 2   Social History   Socioeconomic History  . Marital status: Married    Spouse name: Not on file  . Number of children: 3  . Years of education: Not on file  . Highest education level: Not on file  Occupational History  . Not on file  Social Needs  . Financial resource strain: Not on file  . Food insecurity:    Worry: Not on file    Inability: Not on file  . Transportation needs:    Medical: Not on file    Non-medical: Not on file  Tobacco Use  . Smoking status: Never Smoker  . Smokeless tobacco: Never Used  Substance and Sexual Activity  . Alcohol use: Yes    Comment: WINE 2 X A WEEK   . Drug use: No  . Sexual activity: Yes    Partners: Male    Comment: 1st intercourse- 77, partners- 42, married- 8 yrs   Lifestyle  . Physical activity:    Days per week: Not on file    Minutes per session: Not on file  . Stress: Not on file  Relationships  . Social connections:    Talks on phone: Not on file    Gets together: Not on file    Attends religious service: Not on file    Active member of club or organization: Not on file  Attends meetings of clubs or organizations: Not on file    Relationship status: Not on file  . Intimate partner violence:    Fear of current or ex partner: Not on file    Emotionally abused: Not on file    Physically abused: Not on file    Forced sexual activity: Not on file  Other Topics Concern  . Not on file  Social History Narrative   Marital status: married x 2 years; happily married; no abuse.       Children: 1 daughter (38 months)      Lives: with husband, daughter.     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     Tobacco: none      Alcohol: none      Drugs: none      Exercise:          Family History  Problem Relation Age of Onset  . Hypertension Mother   . Asthma Mother   . Hypertension Maternal Grandmother   . Hypertension Paternal Grandfather   . Heart disease Paternal Grandfather      OBJECTIVE:  Vitals:   09/07/18 1043  BP: 113/61  Pulse: 63  Resp: 17  Temp: 98.4 F (36.9 C)  TempSrc: Oral  SpO2: 100%    General appearance: alert; no distress Eyes: PERRLA; EOMI; conjunctiva normal HENT: normocephalic; atraumatic Neck: supple Lungs: clear to auscultation bilaterally without adventitious breath sounds Heart: regular rate and rhythm.  Clear S1 and S2 without rubs, gallops, or murmur; radial pulse 2+ and intact Chest Wall: no heaves, lifts or thrills; TTP over left side of chest where pectoralis major inserts, mild diffuse TTP over left pectoralis muscle Abdomen: soft, non-tender; bowel sounds normal; no guarding Extremities: no cyanosis or edema; symmetrical with no gross deformities; no calf tenderness Skin: warm and dry Psychological: alert and cooperative; normal mood and affect  ECG: Orders placed or performed during the hospital encounter of 09/07/18  . ED EKG  . ED EKG  . EKG 12-Lead  . EKG 12-Lead   EKG normal sinus rhythm without ST elevations, depressions, or prolonged PR interval.  No narrowing or widening of the QRS complexes.  Reviewed EKG with Dr. Mannie Stabile  DIAGNOSTIC STUDIES:  Dg Chest 2 View  Result Date: 09/07/2018 CLINICAL DATA:  Chest pain. EXAM: CHEST - 2 VIEW COMPARISON:  None. FINDINGS: The heart size and mediastinal contours are within normal limits. Both lungs are clear. No pneumothorax or pleural effusion is noted. The visualized skeletal structures are unremarkable. IMPRESSION: No active cardiopulmonary disease. Electronically Signed   By: Marijo Conception M.D.   On: 09/07/2018 11:40     ASSESSMENT & PLAN:  1. Chest wall pain   2. Strain of left pectoralis muscle, initial encounter     Meds ordered this encounter   Medications  . meloxicam (MOBIC) 7.5 MG tablet    Sig: Take 1 tablet (7.5 mg total) by mouth daily.    Dispense:  30 tablet    Refill:  0    Order Specific Question:   Supervising Provider    Answer:   Raylene Everts [[id number]]   Chest x-ray did not show fracture or dislocation, just scoliosis of the thoracic spine. EKG did not show concerning features, although this does not rule out cardiac cause Symptoms most likely musculoskeletal in nature Rest, ice, heat, and gentle stretching Mobic prescribed.  Take as directed for pain and inflammation. Avoid taking with other antiinflammatories as  this may cause GI upset and/or bleed Follow up with PCP in 1-2 weeks to ensure symptoms are improving Return or go to the ED if you have any new or worsening symptoms such as fever, chills, nausea, vomiting, worsening chest pain, shortness of breath, weakness, numbness or tingling, etc...  Chest pain precautions given. Reviewed expectations re: course of current medical issues. Questions answered. Outlined signs and symptoms indicating need for more acute intervention. Patient verbalized understanding. After Visit Summary given.   Lestine Box, PA-C 09/07/18 1516

## 2018-09-07 NOTE — ED Notes (Signed)
Patient verbalizes understanding of discharge instructions. Opportunity for questioning and answers were provided. Patient discharged from [facility] by provider.  

## 2018-09-07 NOTE — ED Triage Notes (Addendum)
Patient presents to Urgent Care with complaints of chest tightness since last night, intermittently. Patient states her grandmother had a heart attack and htn runs in the family but she has not been diagnosed with it.  Pt states she has been working out 3 times a week via [employer] and has not done any exercises that are out of her normal routine. Husband states that when he felt the pt's sides last night, that the left side felt different than the right side.

## 2018-11-22 ENCOUNTER — Encounter: Payer: Self-pay | Admitting: Physician Assistant

## 2018-11-22 ENCOUNTER — Telehealth: Payer: BC Managed Care – PPO | Admitting: Family

## 2018-11-22 ENCOUNTER — Other Ambulatory Visit: Payer: Self-pay | Admitting: *Deleted

## 2018-11-22 DIAGNOSIS — N760 Acute vaginitis: Secondary | ICD-10-CM | POA: Diagnosis not present

## 2018-11-22 DIAGNOSIS — A6000 Herpesviral infection of urogenital system, unspecified: Secondary | ICD-10-CM

## 2018-11-22 MED ORDER — VALACYCLOVIR HCL 500 MG PO TABS: 500.0000 mg | ORAL_TABLET | Freq: Every day | ORAL | 0 refills | Status: DC

## 2018-11-22 MED ORDER — FLUCONAZOLE 150 MG PO TABS: 150.0000 mg | ORAL_TABLET | Freq: Once | ORAL | 0 refills | Status: AC

## 2018-11-22 NOTE — Progress Notes (Signed)
We are sorry that you are not feeling well. Here is how we plan to help! Based on what you shared with me it looks like you: May have a yeast vaginosis.   Birth control and Valacyclovir refills will have to be done by your primary care provider.   Vaginosis is an inflammation of the vagina that can result in discharge, itching and pain. The cause is usually a change in the normal balance of vaginal bacteria or an infection. Vaginosis can also result from reduced estrogen levels after menopause.  The most common causes of vaginosis are:   Bacterial vaginosis which results from an overgrowth of one on several organisms that are normally present in your vagina.   Yeast infections which are caused by a naturally occurring fungus called candida.   Vaginal atrophy (atrophic vaginosis) which results from the thinning of the vagina from reduced estrogen levels after menopause.   Trichomoniasis which is caused by a parasite and is commonly transmitted by sexual intercourse.  Factors that increase your risk of developing vaginosis include: Marland Kitchen Medications, such as antibiotics and steroids . Uncontrolled diabetes . Use of hygiene products such as bubble bath, vaginal spray or vaginal deodorant . Douching . Wearing damp or tight-fitting clothing . Using an intrauterine device (IUD) for birth control . Hormonal changes, such as those associated with pregnancy, birth control pills or menopause . Sexual activity . Having a sexually transmitted infection  Your treatment plan is A single Diflucan (fluconazole) 150mg  tablet once.  I have electronically sent this prescription into the pharmacy that you have chosen.  Be sure to take all of the medication as directed. Stop taking any medication if you develop a rash, tongue swelling or shortness of breath. Mothers who are breast feeding should consider pumping and discarding their breast milk while on these antibiotics. However, there is no consensus that  infant exposure at these doses would be harmful.  Remember that medication creams can weaken latex condoms. Marland Kitchen   HOME CARE:  Good hygiene may prevent some types of vaginosis from recurring and may relieve some symptoms:  . Avoid baths, hot tubs and whirlpool spas. Rinse soap from your outer genital area after a shower, and dry the area well to prevent irritation. Don't use scented or harsh soaps, such as those with deodorant or antibacterial action. Marland Kitchen Avoid irritants. These include scented tampons and pads. . Wipe from front to back after using the toilet. Doing so avoids spreading fecal bacteria to your vagina.  Other things that may help prevent vaginosis include:  Marland Kitchen Don't douche. Your vagina doesn't require cleansing other than normal bathing. Repetitive douching disrupts the normal organisms that reside in the vagina and can actually increase your risk of vaginal infection. Douching won't clear up a vaginal infection. . Use a latex condom. Both female and female latex condoms may help you avoid infections spread by sexual contact. . Wear cotton underwear. Also wear pantyhose with a cotton crotch. If you feel comfortable without it, skip wearing underwear to bed. Yeast thrives in [employer] Your symptoms should improve in the next day or two.  GET HELP RIGHT AWAY IF:  . You have pain in your lower abdomen ( pelvic area or over your ovaries) . You develop nausea or vomiting . You develop a fever . Your discharge changes or worsens . You have persistent pain with intercourse . You develop shortness of breath, a rapid pulse, or you faint.  These symptoms could be signs of problems  or infections that need to be evaluated by a medical provider now.  MAKE SURE YOU    Understand these instructions.  Will watch your condition.  Will get help right away if you are not doing well or get worse.  Your e-visit answers were reviewed by a board certified advanced clinical  practitioner to complete your personal care plan. Depending upon the condition, your plan could have included both over the counter or prescription medications. Please review your pharmacy choice to make sure that you have choses a pharmacy that is open for you to pick up any needed prescription, Your safety is important to [place]. If you have drug allergies check your prescription carefully.   You can use MyChart to ask questions about today's visit, request a non-urgent call back, or ask for a work or school excuse for 24 hours related to this e-Visit. If it has been greater than 24 hours you will need to follow up with your provider, or enter a new e-Visit to address those concerns. You will get a MyChart message within the next two days asking about your experience. I hope that your e-visit has been valuable and will speed your recovery.  Greater than 5 minutes, yet less than 10 minutes of time have been spent researching, coordinating, and implementing care for this patient today.  Thank you for the details you included in the comment boxes. Those details are very helpful in determining the best course of treatment for you and help [place] to provide the best care.

## 2018-11-30 ENCOUNTER — Encounter: Payer: Self-pay | Admitting: Family Medicine

## 2018-11-30 ENCOUNTER — Ambulatory Visit: Payer: BC Managed Care – PPO | Admitting: Family Medicine

## 2018-11-30 ENCOUNTER — Other Ambulatory Visit: Payer: Self-pay

## 2018-11-30 VITALS — BP 109/72 | HR 79 | Temp 98.7°F | Resp 17 | Ht 65.0 in | Wt 158.8 lb

## 2018-11-30 DIAGNOSIS — N920 Excessive and frequent menstruation with regular cycle: Secondary | ICD-10-CM

## 2018-11-30 DIAGNOSIS — A6000 Herpesviral infection of urogenital system, unspecified: Secondary | ICD-10-CM

## 2018-11-30 MED ORDER — VALACYCLOVIR HCL 500 MG PO TABS: 500.0000 mg | ORAL_TABLET | Freq: Every day | ORAL | 6 refills | Status: DC

## 2018-11-30 MED ORDER — LEVONORGEST-ETH ESTRAD 91-DAY 0.15-0.03 &0.01 MG PO TABS: 1.0000 | ORAL_TABLET | Freq: Every day | ORAL | 4 refills | Status: DC

## 2018-11-30 NOTE — Progress Notes (Signed)
Established Patient Office Visit  Subjective:  Patient ID: Ashley [name], female    DOB: [birth year]  Age: 33 y.o. MRN: [id number]  CC:  Chief Complaint  Patient presents with  . Medication Refill    valacyclovir  . Contraception    discuss getting back on bc and options    HPI [name] presents for   She reports that she takes valtrex for suppression She denies current symptoms.  She would like to get back on OCPs to stop the heavy menses No smoking No migraine No clots No miscarriages No depression   Patient's last menstrual period was 11/24/2018.    Past Medical History:  Diagnosis Date  . Allergy    Zyrtec daily.  . Chlamydia 05/11/2008  . Genital herpes    HSV II.  . Migraine   . Normal pregnancy, repeat 03/27/2013  . S/P tubal ligation 03/28/2013  . Scoliosis   . SGA (small for gestational age), fetal, affecting care of mother, antepartum 03/27/2013  . SVD (spontaneous vaginal delivery) 03/27/2013  . SVD (spontaneous vaginal delivery) 03/27/2013    Past Surgical History:  Procedure Laterality Date  . MYRINGOTOMY WITH TUBE PLACEMENT    . TUBAL LIGATION Bilateral 03/28/2013   Procedure: POST PARTUM TUBAL LIGATION;  Surgeon: Thornell Sartorius, MD;  Location: [facility];  Service: Gynecology;  Laterality: Bilateral;    Family History  Problem Relation Age of Onset  . Hypertension Mother   . Asthma Mother   . Hypertension Maternal Grandmother   . Hypertension Paternal Grandfather   . Heart disease Paternal Grandfather     Social History   Socioeconomic History  . Marital status: Married    Spouse name: Not on file  . Number of children: 3  . Years of education: Not on file  . Highest education level: Not on file  Occupational History  . Not on file  Social Needs  . Financial resource strain: Not on file  . Food insecurity    Worry: Not on file    Inability: Not on file  . Transportation needs    Medical: Not on file    Non-medical: Not on file   Tobacco Use  . Smoking status: Never Smoker  . Smokeless tobacco: Never Used  Substance and Sexual Activity  . Alcohol use: Yes    Comment: WINE 2 X A WEEK   . Drug use: No  . Sexual activity: Yes    Partners: Male    Comment: 1st intercourse- 12, partners- 24, married- 8 yrs   Lifestyle  . Physical activity    Days per week: Not on file    Minutes per session: Not on file  . Stress: Not on file  Relationships  . Social Herbalist on phone: Not on file    Gets together: Not on file    Attends religious service: Not on file    Active member of club or organization: Not on file    Attends meetings of clubs or organizations: Not on file    Relationship status: Not on file  . Intimate partner violence    Fear of current or ex partner: Not on file    Emotionally abused: Not on file    Physically abused: Not on file    Forced sexual activity: Not on file  Other Topics Concern  . Not on file  Social History Narrative   Marital status: married x 2 years; happily married; no abuse.  Children: 1 daughter (59 months)      Lives: with husband, daughter.     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     Tobacco: none      Alcohol: none      Drugs: none      Exercise:          Outpatient Medications Prior to Visit  Medication Sig Dispense Refill  . meloxicam (MOBIC) 7.5 MG tablet Take 1 tablet (7.5 mg total) by mouth daily. 30 tablet 0  . valACYclovir (VALTREX) 500 MG tablet Take 1 tablet (500 mg total) by mouth daily. 30 tablet 0   No facility-administered medications prior to visit.     No Known Allergies  ROS Review of Systems Review of Systems  Constitutional: Negative for activity change, appetite change, chills and fever.  HENT: Negative for congestion, nosebleeds, trouble swallowing and voice change.   Respiratory: Negative for cough, shortness of breath and wheezing.   Gastrointestinal: Negative for diarrhea, nausea and vomiting.   Genitourinary: Negative for difficulty urinating, dysuria, flank pain and hematuria.  Musculoskeletal: Negative for back pain, joint swelling and neck pain.  Neurological: Negative for dizziness, speech difficulty, light-headedness and numbness.  See HPI. All other review of systems negative.     Objective:    Physical Exam  BP 109/72 (BP Location: Right Arm, Patient Position: Sitting, Cuff Size: Normal)   Pulse 79   Temp 98.7 F (37.1 C) (Oral)   Resp 17   Ht 5\' 5"  (1.651 m)   Wt 158 lb 12.8 oz (72 kg)   LMP 11/24/2018   SpO2 98%   BMI 26.43 kg/m  Wt Readings from Last 3 Encounters:  11/30/18 158 lb 12.8 oz (72 kg)  04/12/18 154 lb (69.9 kg)  04/04/18 157 lb (71.2 kg)   Review of Systems  Constitutional: Negative for activity change, appetite change, chills and fever.  HENT: Negative for congestion, nosebleeds, trouble swallowing and voice change.   Respiratory: Negative for cough, shortness of breath and wheezing.   Gastrointestinal: Negative for diarrhea, nausea and vomiting.  Genitourinary: Negative for difficulty urinating, dysuria, flank pain and hematuria.  Musculoskeletal: Negative for back pain, joint swelling and neck pain.  Neurological: Negative for dizziness, speech difficulty, light-headedness and numbness.  See HPI. All other review of systems negative.    Health Maintenance Due  Topic Date Due  . TETANUS/TDAP  02/29/2004    There are no preventive care reminders to display for this patient.  Lab Results  Component Value Date   TSH 1.657 03/09/2012   Lab Results  Component Value Date   WBC 5.7 04/04/2018   HGB 12.5 04/04/2018   HCT 37.5 04/04/2018   MCV 82.5 04/04/2018   PLT 192 03/28/2013   Lab Results  Component Value Date   NA 140 03/09/2012   K 3.8 03/09/2012   CO2 25 03/09/2012   GLUCOSE 90 03/09/2012   BUN 11 03/09/2012   CREATININE 0.74 03/09/2012   BILITOT 0.3 03/09/2012   ALKPHOS 47 03/09/2012   AST 16 03/09/2012   ALT 13  03/09/2012   PROT 7.1 03/09/2012   ALBUMIN 4.4 03/09/2012   CALCIUM 9.4 03/09/2012   No results found for: CHOL No results found for: HDL No results found for: LDLCALC No results found for: TRIG No results found for: [facility] Lab Results  Component Value Date   HGBA1C 4.9 03/09/2012      Assessment & Plan:   Problem List Items  Addressed This Visit    None    Visit Diagnoses    Menorrhagia with regular cycle    -  Primary Since pt is s/p tubal ocps are being used to lighten menses and induce amenorrhea Discussed risk and benefits   Relevant Medications   Levonorgestrel-Ethinyl Estradiol (AMETHIA) 0.15-0.03 &0.01 MG tablet   Genital herpes simplex, unspecified site     Continue current meds   Relevant Medications   valACYclovir (VALTREX) 500 MG tablet      Meds ordered this encounter  Medications  . Levonorgestrel-Ethinyl Estradiol (AMETHIA) 0.15-0.03 &0.01 MG tablet    Sig: Take 1 tablet by mouth daily.    Dispense:  1 Package    Refill:  4  . valACYclovir (VALTREX) 500 MG tablet    Sig: Take 1 tablet (500 mg total) by mouth daily.    Dispense:  30 tablet    Refill:  6    Follow-up: No follow-ups on file.    Forrest Moron, MD

## 2018-11-30 NOTE — Patient Instructions (Signed)
° ° ° °  If you have lab work done today you will be contacted with your lab results within the next 2 weeks.  If you have not heard from us then please contact us. The fastest way to get your results is to register for My Chart. ° ° °IF you received an x-ray today, you will receive an invoice from [facility]. Please contact [facility] at [phone] with questions or concerns regarding your invoice.  ° °IF you received labwork today, you will receive an invoice from [facility]. Please contact [facility] at [phone] with questions or concerns regarding your invoice.  ° °Our billing staff will not be able to assist you with questions regarding bills from these companies. ° °You will be contacted with the lab results as soon as they are available. The fastest way to get your results is to activate your My Chart account. Instructions are located on the last page of this paperwork. If you have not heard from us regarding the results in 2 weeks, please contact this office. °  ° ° ° °

## 2019-03-22 ENCOUNTER — Other Ambulatory Visit: Payer: Self-pay

## 2019-03-22 ENCOUNTER — Encounter: Payer: Self-pay | Admitting: Registered Nurse

## 2019-03-22 ENCOUNTER — Ambulatory Visit: Payer: BC Managed Care – PPO | Admitting: Registered Nurse

## 2019-03-22 VITALS — BP 117/70 | HR 69 | Temp 98.0°F | Resp 16 | Ht 66.54 in | Wt 163.0 lb

## 2019-03-22 DIAGNOSIS — J029 Acute pharyngitis, unspecified: Secondary | ICD-10-CM

## 2019-03-22 LAB — POCT RAPID STREP A (OFFICE): Rapid Strep A Screen: NEGATIVE

## 2019-03-22 MED ORDER — FLUTICASONE PROPIONATE 50 MCG/ACT NA SUSP: 2.0000 | Freq: Every day | NASAL | 6 refills | Status: DC

## 2019-03-22 MED ORDER — AZITHROMYCIN 250 MG PO TABS: ORAL_TABLET | ORAL | 0 refills | Status: DC

## 2019-03-22 NOTE — Patient Instructions (Signed)
° ° ° °  If you have lab work done today you will be contacted with your lab results within the next 2 weeks.  If you have not heard from us then please contact us. The fastest way to get your results is to register for My Chart. ° ° °IF you received an x-ray today, you will receive an invoice from [facility]. Please contact [facility] at [phone] with questions or concerns regarding your invoice.  ° °IF you received labwork today, you will receive an invoice from [facility]. Please contact [facility] at [phone] with questions or concerns regarding your invoice.  ° °Our billing staff will not be able to assist you with questions regarding bills from these companies. ° °You will be contacted with the lab results as soon as they are available. The fastest way to get your results is to activate your My Chart account. Instructions are located on the last page of this paperwork. If you have not heard from us regarding the results in 2 weeks, please contact this office. °  ° ° ° °

## 2019-03-22 NOTE — Progress Notes (Signed)
Established Patient Office Visit  Subjective:  Patient ID: Ashley [name], female    DOB: 09-07-1985  Age: 33 y.o. MRN: [id number]  CC:  Chief Complaint  Patient presents with  . Sore Throat    HPI Larayne Corbo presents for sore throat. Notes that her husband had a sore throat starting last night, and today had a positive rapid strep test.  She notes that she has throat discomfort, particularly when swallowing, but no other symptoms at this time. She does note that she has had frequent strep throat infections in the past, and this is how they usually start for her.  No fever, chills, fatigue, headache, sinus pressure, changes to hearing, ear pain or pressure, nvd   Past Medical History:  Diagnosis Date  . Allergy    Zyrtec daily.  . Chlamydia 05/11/2008  . Genital herpes    HSV II.  . Migraine   . Normal pregnancy, repeat 03/27/2013  . S/P tubal ligation 03/28/2013  . Scoliosis   . SGA (small for gestational age), fetal, affecting care of mother, antepartum 03/27/2013  . SVD (spontaneous vaginal delivery) 03/27/2013  . SVD (spontaneous vaginal delivery) 03/27/2013    Past Surgical History:  Procedure Laterality Date  . MYRINGOTOMY WITH TUBE PLACEMENT    . TUBAL LIGATION Bilateral 03/28/2013   Procedure: POST PARTUM TUBAL LIGATION;  Surgeon: Thornell Sartorius, MD;  Location: [facility];  Service: Gynecology;  Laterality: Bilateral;    Family History  Problem Relation Age of Onset  . Hypertension Mother   . Asthma Mother   . Hypertension Maternal Grandmother   . Hypertension Paternal Grandfather   . Heart disease Paternal Grandfather     Social History   Socioeconomic History  . Marital status: Married    Spouse name: Not on file  . Number of children: 3  . Years of education: Not on file  . Highest education level: Not on file  Occupational History  . Not on file  Social Needs  . Financial resource strain: Not on file  . Food insecurity    Worry: Not on file    Inability:  Not on file  . Transportation needs    Medical: Not on file    Non-medical: Not on file  Tobacco Use  . Smoking status: Never Smoker  . Smokeless tobacco: Never Used  Substance and Sexual Activity  . Alcohol use: Yes    Comment: WINE 2 X A WEEK   . Drug use: No  . Sexual activity: Yes    Partners: Male    Comment: 1st intercourse- 57, partners- 91, married- 8 yrs   Lifestyle  . Physical activity    Days per week: Not on file    Minutes per session: Not on file  . Stress: Not on file  Relationships  . Social Herbalist on phone: Not on file    Gets together: Not on file    Attends religious service: Not on file    Active member of club or organization: Not on file    Attends meetings of clubs or organizations: Not on file    Relationship status: Not on file  . Intimate partner violence    Fear of current or ex partner: Not on file    Emotionally abused: Not on file    Physically abused: Not on file    Forced sexual activity: Not on file  Other Topics Concern  . Not on file  Social History Narrative  Marital status: married x 2 years; happily married; no abuse.       Children: 1 daughter (51 months)      Lives: with husband, daughter.     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     Tobacco: none      Alcohol: none      Drugs: none      Exercise:          Outpatient Medications Prior to Visit  Medication Sig Dispense Refill  . Levonorgestrel-Ethinyl Estradiol (AMETHIA) 0.15-0.03 &0.01 MG tablet Take 1 tablet by mouth daily. 1 Package 4  . meloxicam (MOBIC) 7.5 MG tablet Take 1 tablet (7.5 mg total) by mouth daily. 30 tablet 0  . valACYclovir (VALTREX) 500 MG tablet Take 1 tablet (500 mg total) by mouth daily. 30 tablet 6   No facility-administered medications prior to visit.     No Known Allergies  ROS Review of Systems  Constitutional: Negative.   HENT: Positive for sore throat and trouble swallowing. Negative for congestion,  drooling, postnasal drip, sinus pressure and sinus pain.   Eyes: Negative.   Respiratory: Negative.   Cardiovascular: Negative.   Gastrointestinal: Negative.   Endocrine: Negative.   Genitourinary: Negative.   Musculoskeletal: Negative.   Skin: Negative.   Allergic/Immunologic: Negative.   Neurological: Negative.   Hematological: Negative.   Psychiatric/Behavioral: Negative.   All other systems reviewed and are negative.     Objective:    Physical Exam  Constitutional: She is oriented to person, place, and time. She appears well-developed and well-nourished. No distress.  HENT:  Mouth/Throat: Uvula is midline and mucous membranes are normal. Posterior oropharyngeal edema and posterior oropharyngeal erythema present. No oropharyngeal exudate.    Cardiovascular: Normal rate and regular rhythm.  Pulmonary/Chest: Effort normal. No respiratory distress.  Neurological: She is alert and oriented to person, place, and time.  Skin: Skin is warm and dry. No rash noted. She is not diaphoretic. No erythema. No pallor.  Psychiatric: She has a normal mood and affect. Her behavior is normal. Judgment and thought content normal.  Nursing note and vitals reviewed.   BP 117/70   Pulse 69   Temp 98 F (36.7 C) (Oral)   Resp 16   Ht 5' 6.54" (1.69 m)   Wt 163 lb (73.9 kg)   SpO2 99%   BMI 25.89 kg/m  Wt Readings from Last 3 Encounters:  03/22/19 163 lb (73.9 kg)  11/30/18 158 lb 12.8 oz (72 kg)  04/12/18 154 lb (69.9 kg)     Health Maintenance Due  Topic Date Due  . TETANUS/TDAP  02/29/2004    There are no preventive care reminders to display for this patient.  Lab Results  Component Value Date   TSH 1.657 03/09/2012   Lab Results  Component Value Date   WBC 5.7 04/04/2018   HGB 12.5 04/04/2018   HCT 37.5 04/04/2018   MCV 82.5 04/04/2018   PLT 192 03/28/2013   Lab Results  Component Value Date   NA 140 03/09/2012   K 3.8 03/09/2012   CO2 25 03/09/2012   GLUCOSE  90 03/09/2012   BUN 11 03/09/2012   CREATININE 0.74 03/09/2012   BILITOT 0.3 03/09/2012   ALKPHOS 47 03/09/2012   AST 16 03/09/2012   ALT 13 03/09/2012   PROT 7.1 03/09/2012   ALBUMIN 4.4 03/09/2012   CALCIUM 9.4 03/09/2012   No results found for: CHOL No results found for: HDL No results  found for: [place] No results found for: TRIG No results found for: CHOLHDL Lab Results  Component Value Date   HGBA1C 4.9 03/09/2012      Assessment & Plan:   Problem List Items Addressed This Visit    None    Visit Diagnoses    Sore throat    -  Primary   Relevant Medications   azithromycin (ZITHROMAX) 250 MG tablet   fluticasone (FLONASE) 50 MCG/ACT nasal spray   Other Relevant Orders   POCT rapid strep A   Throat Culture      Meds ordered this encounter  Medications  . azithromycin (ZITHROMAX) 250 MG tablet    Sig: Take 2 tabs on first day. Then take 1 tab daily. Finish supply.    Dispense:  6 tablet    Refill:  0    Order Specific Question:   Supervising Provider    Answer:   Delia Chimes A [id number]  . fluticasone (FLONASE) 50 MCG/ACT nasal spray    Sig: Place 2 sprays into both nostrils daily.    Dispense:  16 g    Refill:  6    Order Specific Question:   Supervising Provider    Answer:   Forrest Moron [id number]    Follow-up: No follow-ups on file.   PLAN  Rapid strep negative  Based on tonsillar edema and husband testing positive, will treat preemptively. z pack and fluticasone in case she experiences any congestion  Patient encouraged to call clinic with any questions, comments, or concerns.   Maximiano Coss, NP

## 2019-03-24 LAB — CULTURE, GROUP A STREP: Strep A Culture: NEGATIVE

## 2019-03-25 ENCOUNTER — Encounter: Payer: Self-pay | Admitting: Registered Nurse

## 2019-03-27 ENCOUNTER — Other Ambulatory Visit: Payer: Self-pay | Admitting: Registered Nurse

## 2019-03-27 DIAGNOSIS — J351 Hypertrophy of tonsils: Secondary | ICD-10-CM

## 2019-06-28 ENCOUNTER — Other Ambulatory Visit: Payer: Self-pay | Admitting: Obstetrics and Gynecology

## 2019-07-13 ENCOUNTER — Other Ambulatory Visit: Payer: Self-pay

## 2019-07-13 ENCOUNTER — Encounter (HOSPITAL_BASED_OUTPATIENT_CLINIC_OR_DEPARTMENT_OTHER): Payer: Self-pay | Admitting: Obstetrics and Gynecology

## 2019-07-13 NOTE — Progress Notes (Signed)
Spoke w/ via phone for pre-op interview--- PT Lab needs dos----  Urine preg (anes.);  Pre-op orders pending             Lab results------ no COVID test ------ 03-06-201 @ 0920 Arrive at ------- 1030 NPO after ------ MN Medications to take morning of surgery ----- Megestrol w/ sips water Diabetic medication ----- n/a Patient Special Instructions ----- n/a Pre-Op special Istructions ----- pt case just added on Patient verbalized understanding of instructions that were given at this phone interview. Patient denies shortness of breath, chest pain, fever, cough a this phone interview.

## 2019-07-15 ENCOUNTER — Other Ambulatory Visit (HOSPITAL_COMMUNITY)
Admission: RE | Admit: 2019-07-15 | Discharge: 2019-07-15 | Disposition: A | Discharge status: Home / Self Care | Payer: BC Managed Care – PPO | Source: Ambulatory Visit | Attending: Obstetrics and Gynecology | Admitting: Obstetrics and Gynecology

## 2019-07-15 DIAGNOSIS — Z01812 Encounter for preprocedural laboratory examination: Secondary | ICD-10-CM | POA: Diagnosis not present

## 2019-07-15 DIAGNOSIS — Z20822 Contact with and (suspected) exposure to covid-19: Secondary | ICD-10-CM | POA: Diagnosis not present

## 2019-07-15 LAB — SARS CORONAVIRUS 2 (TAT 6-24 HRS): SARS Coronavirus 2: NEGATIVE

## 2019-07-18 NOTE — H&P (Signed)
History of Present Illness  General:          34 y/o presents for Pre-operative examination prior to Hysteroscopy, D&C, Myomectomy, HTA endometrial ablation for management of AUB.        Pelvic ultrasound results from today revealed, fibroids x 11. submucosal fibroids x2, largest 2.5 cm. Results discussed with pt. Uterus sounded to 10 cm during EMB 06/2019.         She is bleeding/spotting almost daily. Blood in the morning is red, currently still on menses. Hemoglobin was 11.5 on 06/28/19. Isolation Precautions:          Respiratory Illness Screening             1. Is fever present / reported?  No           2. Are respiratory illness symptom(s) present / reported?  No           3. Are other symptom(s) present / reported?  No           5. Has there been reported travel to a High Risk respiratory illness region?  Unknown           6. Has close* contact with person(s) known to have communicable illness been reported?  No        Has patient been tested for COVID-19? No.         Has patient received COVID-19 vaccination? No.     Current Medications  TakingFusion Plus - Capsule 1 capsule between meals Orally Once a day    Megestrol Acetate 40 MG Tablet 1 tablet Orally Once a day    Ibuprofen 800 MG Tablet 1 tablet with food or milk as needed Orally every 8 hours prn    Valcyclovir 500 mg BID/5d 500 mg tabs one tab as needed orally twice a day    Vitamin B Complex - Tablet as directed Orally    OTC Vitamin C 1 capsule orally once a day    OTC Vitamin D Orally Once a day    Probiotic - Capsule as directed Orally    Multivitamin - Tablet 1 tablet Orally Once a day    Not-TakingProvera(medroxyPROGESTERone) 10 MG Tablet 2 tablets with food Orally one tablet daily    Percocet(oxyCODONE-Acetaminophen) 5-325 MG Tablet 1 tablet as needed Orally 30 min prior to procedure    Cytotec(miSOPROStol) 200 MCG Tablet 1 tablet Vaginal 12 and 24 hours prior to procedure    Medication List reviewed  and reconciled with the patient        Past Medical History       HSV 2.        Fibroids.            Surgical History  tubal ligation 03/2013      Family History  Father: deceased  Mother: alive, diagnosed with Hypertension  Maternal Grand Mother: alive, Diabetes  2daughter(s) .   Maternal [place] and second cousin with breast cancer.      Social History  General:         Tobacco use               cigarettes:  Never smoked             Tobacco history last updated  07/14/2019       Alcohol: yes, wine.        no Recreational drug use.        Marital Status: married.  Children: 2, daughter (s).        EDUCATION: College.        OCCUPATION: HS Music therapist.     Gyn History  Sexual activity currently sexually active.   Periods : every month.   LMP 06/14/2019-current.   Birth control BTL.   Last pap smear date 2019-per pt.   Denies H/O Last mammogram date.   Abnormal pap smear yes.   STD HSV 2.      OB History  Number of pregnancies  2.   Pregnancy # 1  live birth, vaginal delivery, girl.   Pregnancy # 2  live birth, vaginal delivery, girl.      Allergies  N.K.D.A.      Hospitalization/Major Diagnostic Procedure  No Hospitalization History.      Review of Systems  See scanned ROS form for details. See HPI Denies fever/chills, chest pain, SOB, headaches, numbness/tingling. No h/o complication with anesthesia, bleeding disorders or blood clots See HPI.    Vital Signs  Wt 162.4, Wt change -2.7 lb, Ht 66, BMI 26.21, Temp 97.6, Pulse sitting 85, BP sitting 118/90.    Physical Examination  Chaperone present:          Chaperone present  [facility] 07/14/2019 09:43:31 AM > , for pelvic exam.   GENERAL:          Patient appears  alert and oriented , alert and oriented.          General Appearance:  well-appearing, well-developed, no acute distress , well-appearing, well-developed, no acute distress.          Speech:  clear , clear.   LUNGS:           Auscultation:  no wheezing/rhonchi/rales. CTA bilaterally.          Effort:  no respiratory distress, comfortable breathing.   HEART:          Heart sounds:  normal. RRR. no murmur.   ABDOMEN:          General:  soft nontender, nondistended, no masses , no masses tenderness or organomegaly, non distended.   FEMALE GENITOURINARY:          Cervix  visualized, healthy appearing, no discharge, no lesions.          Adnexa:  no mass, non tender.          Uterus:  freely mobile, non tender, Irregularly enlarged uterus.          Vagina:  pink/moist mucosa, no lesions, no abnormal discharge, Red milky discharge c/w menses.          Vulva:  normal, no lesions, no skin discoloration.          Anus:  no external hemosrhoids.   EXTREMITIES:          general  no edema.          General:  No edema or calf tenderness.       Pt aware of scribe services today.    Assessments    1. Encounter for other preprocedural examination - Z01.818 (Primary)  2. Abnormal uterine bleeding (AUB) - N93.9      Treatment  1. Encounter for other preprocedural examination   Notes: Pelvic ultrasound results from today revealed, fibroids x 11. submucosal fibroids x2, largest 2.5 cm. Results discussed with pt. Uterus sounded to 10 cm during EMB 06/2019. Pt counseled on R/B/A of Hysteroscopy, D&C, Myomectomy, HTA endometrial ablation, including but not limited to infection, bleeding and  injury to organs in the abdomen. Discussed procedures and recovery time. No additional pre-operative clearance needed at this time, no concerning medical Dx that may complicate the procedure. Plan for f/u 2 week post op. .      2. Abnormal uterine bleeding (AUB)   Notes: Pelvic ultrasound results from today revealed, fibroids x 11. submucosal fibroids x2, largest 2.5 cm. Results discussed with pt. Uterus sounded to 10 cm during EMB 06/2019. Pt counseled on R/B/A of Hysteroscopy, D&C, Myomectomy, HTA endometrial ablation, including but not  limited to infection, bleeding and injury to organs in the abdomen. Discussed procedures and recovery time. No additional pre-operative clearance needed at this time, no concerning medical Dx that may complicate the procedure. Plan for f/u 2 week post op.        Procedures  Scribe Documentation:          Attestation:  I personally scribed for Dr. Simona Huh on the date of this appointment. Electronically signed by scribe , Anastasio Auerbach 07/14/2019 09:40:30 AM > .   Follow Up  2 Weeks post op

## 2019-07-18 NOTE — Progress Notes (Signed)
Called surgery scheduler numbers and main number for office, to request orders, and calls would not go through. Left voicemail on Dr. Andy Gauss phone requesting orders for patient.

## 2019-07-19 ENCOUNTER — Ambulatory Visit (HOSPITAL_BASED_OUTPATIENT_CLINIC_OR_DEPARTMENT_OTHER): Payer: BC Managed Care – PPO | Admitting: Certified Registered"

## 2019-07-19 ENCOUNTER — Encounter (HOSPITAL_BASED_OUTPATIENT_CLINIC_OR_DEPARTMENT_OTHER): Payer: Self-pay | Admitting: Obstetrics and Gynecology

## 2019-07-19 ENCOUNTER — Ambulatory Visit (HOSPITAL_BASED_OUTPATIENT_CLINIC_OR_DEPARTMENT_OTHER)
Admission: RE | Admit: 2019-07-19 | Discharge: 2019-07-19 | Disposition: A | Discharge status: Home / Self Care | Payer: BC Managed Care – PPO | Attending: Obstetrics and Gynecology | Admitting: Obstetrics and Gynecology

## 2019-07-19 ENCOUNTER — Encounter (HOSPITAL_BASED_OUTPATIENT_CLINIC_OR_DEPARTMENT_OTHER)
Admission: RE | Disposition: A | Discharge status: Home / Self Care | Payer: Self-pay | Source: Home / Self Care | Attending: Obstetrics and Gynecology

## 2019-07-19 ENCOUNTER — Other Ambulatory Visit: Payer: Self-pay

## 2019-07-19 DIAGNOSIS — Z79818 Long term (current) use of other agents affecting estrogen receptors and estrogen levels: Secondary | ICD-10-CM | POA: Diagnosis not present

## 2019-07-19 DIAGNOSIS — D250 Submucous leiomyoma of uterus: Secondary | ICD-10-CM | POA: Insufficient documentation

## 2019-07-19 DIAGNOSIS — N939 Abnormal uterine and vaginal bleeding, unspecified: Secondary | ICD-10-CM | POA: Insufficient documentation

## 2019-07-19 HISTORY — DX: Abnormal uterine and vaginal bleeding, unspecified: N93.9

## 2019-07-19 HISTORY — PX: DILITATION & CURRETTAGE/HYSTROSCOPY WITH HYDROTHERMAL ABLATION: SHX5570

## 2019-07-19 HISTORY — DX: Iron deficiency anemia, unspecified: D50.9

## 2019-07-19 LAB — POCT PREGNANCY, URINE: Preg Test, Ur: NEGATIVE

## 2019-07-19 SURGERY — DILATATION & CURETTAGE/HYSTEROSCOPY WITH HYDROTHERMAL ABLATION
Anesthesia: General | Site: Vagina

## 2019-07-19 MED ORDER — VASOPRESSIN 20 UNIT/ML IV SOLN
INTRAVENOUS | Status: DC | PRN
  Administered 2019-07-19: 5 mL via INTRAMUSCULAR

## 2019-07-19 MED ORDER — PROPOFOL 10 MG/ML IV BOLUS
INTRAVENOUS | Status: DC | PRN
  Administered 2019-07-19: 150 mg via INTRAVENOUS

## 2019-07-19 MED ORDER — LACTATED RINGERS IV SOLN
INTRAVENOUS | Status: DC
  Administered 2019-07-19: 50 mL/h via INTRAVENOUS
  Filled 2019-07-19: qty 1000

## 2019-07-19 MED ORDER — DEXAMETHASONE SODIUM PHOSPHATE 4 MG/ML IJ SOLN
INTRAMUSCULAR | Status: DC | PRN
  Administered 2019-07-19: 10 mg via INTRAVENOUS

## 2019-07-19 MED ORDER — FENTANYL CITRATE (PF) 100 MCG/2ML IJ SOLN
INTRAMUSCULAR | Status: AC
  Filled 2019-07-19: qty 2

## 2019-07-19 MED ORDER — ACETAMINOPHEN 500 MG PO TABS
ORAL_TABLET | ORAL | Status: AC
  Filled 2019-07-19: qty 2

## 2019-07-19 MED ORDER — PROPOFOL 10 MG/ML IV BOLUS
INTRAVENOUS | Status: AC
  Filled 2019-07-19: qty 20

## 2019-07-19 MED ORDER — FENTANYL CITRATE (PF) 100 MCG/2ML IJ SOLN
INTRAMUSCULAR | Status: DC | PRN
  Administered 2019-07-19 (×2): 50 ug via INTRAVENOUS

## 2019-07-19 MED ORDER — LIDOCAINE HCL 2 % IJ SOLN
INTRAMUSCULAR | Status: DC | PRN
  Administered 2019-07-19: 10 mL

## 2019-07-19 MED ORDER — FENTANYL CITRATE (PF) 100 MCG/2ML IJ SOLN
25.0000 ug | INTRAMUSCULAR | Status: DC | PRN
  Administered 2019-07-19: 50 ug via INTRAVENOUS
  Filled 2019-07-19: qty 1

## 2019-07-19 MED ORDER — IBUPROFEN 800 MG PO TABS: 800.0000 mg | ORAL_TABLET | Freq: Three times a day (TID) | ORAL | 0 refills | Status: AC | PRN

## 2019-07-19 MED ORDER — SODIUM CHLORIDE 0.9 % IR SOLN
Status: DC | PRN
  Administered 2019-07-19: 3000 mL

## 2019-07-19 MED ORDER — SILVER NITRATE-POT NITRATE 75-25 % EX MISC
CUTANEOUS | Status: DC | PRN
  Administered 2019-07-19: 2

## 2019-07-19 MED ORDER — OXYCODONE HCL 5 MG PO TABS: 5.0000 mg | ORAL_TABLET | ORAL | 0 refills | Status: AC | PRN

## 2019-07-19 MED ORDER — ONDANSETRON HCL 4 MG/2ML IJ SOLN
INTRAMUSCULAR | Status: DC | PRN
  Administered 2019-07-19: 4 mg via INTRAVENOUS

## 2019-07-19 MED ORDER — MIDAZOLAM HCL 5 MG/5ML IJ SOLN
INTRAMUSCULAR | Status: DC | PRN
  Administered 2019-07-19: 2 mg via INTRAVENOUS

## 2019-07-19 MED ORDER — MIDAZOLAM HCL 2 MG/2ML IJ SOLN
INTRAMUSCULAR | Status: AC
  Filled 2019-07-19: qty 2

## 2019-07-19 MED ORDER — LIDOCAINE HCL (CARDIAC) PF 100 MG/5ML IV SOSY
PREFILLED_SYRINGE | INTRAVENOUS | Status: DC | PRN
  Administered 2019-07-19: 60 mg via INTRAVENOUS

## 2019-07-19 MED ORDER — ACETAMINOPHEN 500 MG PO TABS
1000.0000 mg | ORAL_TABLET | Freq: Once | ORAL | Status: AC
  Administered 2019-07-19: 12:00:00 1000 mg via ORAL
  Filled 2019-07-19: qty 2

## 2019-07-19 MED ORDER — KETOROLAC TROMETHAMINE 30 MG/ML IJ SOLN
INTRAMUSCULAR | Status: DC | PRN
  Administered 2019-07-19: 30 mg via INTRAVENOUS

## 2019-07-19 MED ORDER — LIDOCAINE 2% (20 MG/ML) 5 ML SYRINGE
INTRAMUSCULAR | Status: AC
  Filled 2019-07-19: qty 5

## 2019-07-19 SURGICAL SUPPLY — 25 items
CANISTER SUCT 3000ML PPV (MISCELLANEOUS) ×1 IMPLANT
CATH ROBINSON RED A/P 16FR (CATHETERS) ×1 IMPLANT
CLOTH BEACON ORANGE TIMEOUT ST (SAFETY) ×3 IMPLANT
CNTNR URN SCR LID CUP LEK RST (MISCELLANEOUS) ×2 IMPLANT
CONT SPEC 4OZ STRL OR WHT (MISCELLANEOUS)
COVER WAND RF STERILE (DRAPES) ×3 IMPLANT
DILATOR CANAL MILEX (MISCELLANEOUS) ×3 IMPLANT
GAUZE 4X4 16PLY RFD (DISPOSABLE) ×3 IMPLANT
GLOVE BIO SURGEON STRL SZ 6.5 (GLOVE) ×1 IMPLANT
GLOVE BIO SURGEON STRL SZ7 (GLOVE) ×3 IMPLANT
GLOVE BIO SURGEONS STRL SZ 6.5 (GLOVE) ×1
GLOVE BIOGEL PI IND STRL 6.5 (GLOVE) IMPLANT
GLOVE BIOGEL PI IND STRL 7.0 (GLOVE) ×2 IMPLANT
GLOVE BIOGEL PI IND STRL 7.5 (GLOVE) IMPLANT
GLOVE BIOGEL PI INDICATOR 6.5 (GLOVE) ×2
GLOVE BIOGEL PI INDICATOR 7.0 (GLOVE) ×4
GLOVE BIOGEL PI INDICATOR 7.5 (GLOVE) ×2
GOWN STRL REUS W/TWL LRG LVL3 (GOWN DISPOSABLE) ×6 IMPLANT
IV NS IRRIG 3000ML ARTHROMATIC (IV SOLUTION) ×4 IMPLANT
KIT PROCEDURE FLUENT (KITS) IMPLANT
PACK VAGINAL MINOR WOMEN LF (CUSTOM PROCEDURE TRAY) ×3 IMPLANT
PAD OB MATERNITY 4.3X12.25 (PERSONAL CARE ITEMS) ×3 IMPLANT
PAD PREP 24X48 CUFFED NSTRL (MISCELLANEOUS) ×3 IMPLANT
SET GENESYS HTA PROCERVA (MISCELLANEOUS) ×3 IMPLANT
TOWEL OR 17X26 10 PK STRL BLUE (TOWEL DISPOSABLE) ×6 IMPLANT

## 2019-07-19 NOTE — Op Note (Signed)
[name], [name] MEDICAL RECORD [id number] ACCOUNT [id number] DATE OF BIRTH:1985/09/30 FACILITY: [facility] LOCATION: [facility] PHYSICIAN:Welles W[place]all Al Decant, MD  OPERATIVE REPORT  DATE OF PROCEDURE:  07/19/2019  PREOPERATIVE DIAGNOSES:   1.  Abnormal uterine bleeding.   2.  Fibroid uterus.  3.  Submucosal fibroids.  PROCEDURE:  Hysteroscopy with [facility] ablation.  SURGEON:  Thurnell Lose, MD  ASSISTANT:  Technician.  ANESTHESIA:  Local and general.  ESTIMATED BLOOD LOSS:  5 mL.  DRAINS:  None (patient voided prior to coming back to the OR).  LOCAL:   1.  Lidocaine 2%, 10 mL.   2.  Vasopressin, 20 and 55 mL.  SPECIMEN:  None.  DISPOSITION:  To PACU, hemodynamically stable.  FINDINGS:  An anterior fibroid near the fundus and an anterior right lateral fibroid partially in the cavity.  Endometrium appeared normal.  No masses, no polyps.  DESCRIPTION OF PROCEDURE:  The patient was identified in the holding area.  She was then taken to the operating room with IV running.  She underwent general anesthesia without complication.  She was then prepped and draped in the dorsal lithotomy  position.  A timeout was performed.  SCDs were on her legs and operating.  A Graves speculum was inserted into the vagina.  The anterior lip of the cervix was injected with 2% lidocaine and the single-tooth tenaculum was then placed at the anterior lip of the cervix.  The paracervical block was completed with the remainder of  the lidocaine for a total of 10 mL.  The cervix was then dilated with an os finder and then up to a Mitchell. The patient's cervix was very anteverted, so I was able to get the hysteroscope with the sheath through into the endometrial cavity.  Just based  on how long it took the cavity to clear, the patient's cavity was large.  There was some bleeding upon entry, which I think might have contributed to the leakage test.  The HTA ablation was then started.  It did not  pass the leak test in the first  round.  I then injected vasopressin and added a second single-tooth tenaculum from 12 o'clock to 7 o'clock.  The leakage was then resolved and we proceeded with the ablation.  The ablation proceeded without any issue.  There were no alarms and after the  procedure,  the fibroids had probably shrunk to approximately 75% of the previous size.  For that reason, I did not proceed with the myomectomy.  The endometrium also appeared very well ablated.  All instrument and sponge counts were correct x3 and the patient tolerated the procedure well.  VN/NUANCE  D:07/19/2019 T:07/19/2019 JOB:[id number]

## 2019-07-19 NOTE — Brief Op Note (Signed)
07/19/2019  1:42 PM  PATIENT:  Ashley [name]  34 y.o. female  PRE-OPERATIVE DIAGNOSIS:  N93.9 abnormal uterine bleeding, Fibroid uterus, submucosal fibroids.  POST-OPERATIVE DIAGNOSIS:  Same  PROCEDURE:  Procedure(s): /HYSTEROSCOPY WITH HYDROTHERMAL ABLATION AND MYOMECTOMY (N/A)  SURGEON:  Surgeon(s) and Role:    Thurnell Lose, MD - Primary  PHYSICIAN ASSISTANT: None  ASSISTANTS: Technician   ANESTHESIA:   local and general  EBL:  5 mL   BLOOD ADMINISTERED:none  DRAINS: none, pt voided prior to coming back to the OR  LOCAL MEDICATIONS USED:  LIDOCAINE , Amount: 10 ml and OTHER Vasopressin 20/50 5 ml  SPECIMEN:  No Specimen  DISPOSITION OF SPECIMEN:  N/A  COUNTS:  YES  TOURNIQUET:  * No tourniquets in log *  DICTATION: .Other Dictation: Dictation Number [id number]  PLAN OF CARE: Discharge to home after PACU  PATIENT DISPOSITION:  PACU - hemodynamically stable.   Delay start of Pharmacological VTE agent (>24hrs) due to surgical blood loss or risk of bleeding: not applicable

## 2019-07-19 NOTE — Discharge Instructions (Addendum)
Post Anesthesia Home Care Instructions  Activity: Get plenty of rest for the remainder of the day. A responsible adult should stay with you for 24 hours following the procedure.  For the next 24 hours, DO NOT: -Drive a car -Paediatric nurse -Drink alcoholic beverages -Take any medication unless instructed by your physician -Make any legal decisions or sign important papers.  Meals: Start with liquid foods such as gelatin or soup. Progress to regular foods as tolerated. Avoid greasy, spicy, heavy foods. If nausea and/or vomiting occur, drink only clear liquids until the nausea and/or vomiting subsides. Call your physician if vomiting continues.  Special Instructions/Symptoms: Your throat may feel dry or sore from the anesthesia or the breathing tube placed in your throat during surgery. If this causes discomfort, gargle with warm salt water. The discomfort should disappear within 24 hours.  If you had a scopolamine patch placed behind your ear for the management of post- operative nausea and/or vomiting:  1. The medication in the patch is effective for 72 hours, after which it should be removed.  Wrap patch in a tissue and discard in the trash. Wash hands thoroughly with soap and water. 2. You may remove the patch earlier than 72 hours if you experience unpleasant side effects which may include dry mouth, dizziness or visual disturbances. 3. Avoid touching the patch. Wash your hands with soap and water after contact with the patch.    DISCHARGE INSTRUCTIONS: HYSTEROSCOPY / ENDOMETRIAL ABLATION The following instructions have been prepared to help you care for yourself upon your return home.  May Remove Scop patch on or before :  N/A  May take Ibuprofen after 7pm  May take stool softner while taking narcotic pain medication to prevent constipation.  Drink plenty of water.  Personal hygiene: Marland Kitchen Use sanitary pads for vaginal drainage, not tampons. . Shower the day after your  procedure. . NO tub baths, pools or Jacuzzis for 2-3 weeks. . Wipe front to back after using the bathroom.  Activity and limitations: . Do NOT drive or operate any equipment for 24 hours. The effects of anesthesia are still present and drowsiness may result. . Do NOT rest in bed all day. . Walking is encouraged. . Walk up and down stairs slowly. . You may resume your normal activity in one to two days or as indicated by your physician. Sexual activity: NO intercourse for at least 2 weeks after the procedure, or as indicated by your Doctor.  Diet: Eat a light meal as desired this evening. You may resume your usual diet tomorrow.  Return to Work: You may resume your work activities in one to two days or as indicated by Marine scientist.  What to expect after your surgery: Expect to have vaginal bleeding/discharge for 2-3 days and spotting for up to 10 days. It is not unusual to have soreness for up to 1-2 weeks. You may have a slight burning sensation when you urinate for the first day. Mild cramps may continue for a couple of days. You may have a regular period in 2-6 weeks.  Call your doctor for any of the following: . Excessive vaginal bleeding or clotting, saturating and changing one pad every hour. . Inability to urinate 6 hours after discharge from hospital. . Pain not relieved by pain medication. . Fever of 100.4 F or greater . Unusual vaginal discharge or odor       Endometrial Ablation Endometrial ablation is a procedure that destroys the thin inner layer of the lining  of the uterus (endometrium). This procedure may be done:  To stop heavy periods.  To stop bleeding that is causing anemia.  To control irregular bleeding.  To treat bleeding caused by small tumors (fibroids) in the endometrium. This procedure is often an alternative to major surgery, such as removal of the uterus and cervix (hysterectomy). As a result of this procedure:  You may not be able to have  children. However, if you are premenopausal (you have not gone through menopause): ? You may still have a small chance of getting pregnant. ? You will need to use a reliable method of birth control after the procedure to prevent pregnancy.  You may stop having a menstrual period, or you may have only a small amount of bleeding during your period. Menstruation may return several years after the procedure. Tell a health care provider about:  Any allergies you have.  All medicines you are taking, including vitamins, herbs, eye drops, creams, and over-the-counter medicines.  Any problems you or family members have had with the use of anesthetic medicines.  Any blood disorders you have.  Any surgeries you have had.  Any medical conditions you have. What are the risks? Generally, this is a safe procedure. However, problems may occur, including:  A hole (perforation) in the uterus or bowel.  Infection of the uterus, bladder, or vagina.  Bleeding.  Damage to other structures or organs.  An air bubble in the lung (air embolus).  Problems with pregnancy after the procedure.  Failure of the procedure.  Decreased ability to diagnose cancer in the endometrium. What happens before the procedure?  You will have tests of your endometrium to make sure there are no pre-cancerous cells or cancer cells present.  You may have an ultrasound of the uterus.  You may be given medicines to thin the endometrium.  Ask your health care provider about: ? Changing or stopping your regular medicines. This is especially important if you take diabetes medicines or blood thinners. ? Taking medicines such as aspirin and ibuprofen. These medicines can thin your blood. Do not take these medicines before your procedure if your doctor tells you not to.  Plan to have someone take you home from the hospital or clinic. What happens during the procedure?   You will lie on an exam table with your feet and  legs supported as in a pelvic exam.  To lower your risk of infection: ? Your health care team will wash or sanitize their hands and put on germ-free (sterile) gloves. ? Your genital area will be washed with soap.  An IV tube will be inserted into one of your veins.  You will be given a medicine to help you relax (sedative).  A surgical instrument with a light and camera (resectoscope) will be inserted into your vagina and moved into your uterus. This allows your surgeon to see inside your uterus.  Endometrial tissue will be removed using one of the following methods: ? Radiofrequency. This method uses a radiofrequency-alternating electric current to remove the endometrium. ? Cryotherapy. This method uses extreme cold to freeze the endometrium. ? Heated-free liquid. This method uses a heated saltwater (saline) solution to remove the endometrium. ? Microwave. This method uses high-energy microwaves to heat up the endometrium and remove it. ? Thermal balloon. This method involves inserting a catheter with a balloon tip into the uterus. The balloon tip is filled with heated fluid to remove the endometrium. The procedure may vary among health care providers and  hospitals. What happens after the procedure?  Your blood pressure, heart rate, breathing rate, and blood oxygen level will be monitored until the medicines you were given have worn off.  As tissue healing occurs, you may notice vaginal bleeding for 4-6 weeks after the procedure. You may also experience: ? Cramps. ? Thin, watery vaginal discharge that is light pink or brown in color. ? A need to urinate more frequently than usual. ? Nausea.  Do not drive for 24 hours if you were given a sedative.  Do not have sex or insert anything into your vagina until your health care provider approves. Summary  Endometrial ablation is done to treat the many causes of heavy menstrual bleeding.  The procedure may be done only after medications  have been tried to control the bleeding.  Plan to have someone take you home from the hospital or clinic. This information is not intended to replace advice given to you by your health care provider. Make sure you discuss any questions you have with your health care provider. Document Revised: 10/12/2017 Document Reviewed: 05/14/2016 Elsevier Patient Education  [place].

## 2019-07-19 NOTE — Transfer of Care (Signed)
Immediate Anesthesia Transfer of Care Note  Patient: Ashley [name]  Procedure(s) Performed: Procedure(s) (LRB): /HYSTEROSCOPY WITH HYDROTHERMAL ABLATION AND MYOMECTOMY (N/A)  Patient Location: PACU  Anesthesia Type: General  Level of Consciousness: awake, sedated, patient cooperative and responds to stimulation  Airway & Oxygen Therapy: Patient Spontanous Breathing and Patient connected to [place] 02 and soft FM   Post-op Assessment: Report given to PACU RN, Post -op Vital signs reviewed and stable and Patient moving all extremities  Post vital signs: Reviewed and stable  Complications: No apparent anesthesia complications

## 2019-07-19 NOTE — Anesthesia Preprocedure Evaluation (Addendum)
Anesthesia Evaluation  Patient identified by MRN, date of birth, ID band Patient awake    Reviewed: Allergy & Precautions, NPO status , Patient's Chart, lab work & pertinent test results  Airway Mallampati: II  TM Distance: >3 FB Neck ROM: Full    Dental no notable dental hx.    Pulmonary neg pulmonary ROS,    Pulmonary exam normal breath sounds clear to auscultation       Cardiovascular negative cardio ROS Normal cardiovascular exam Rhythm:Regular Rate:Normal     Neuro/Psych negative neurological ROS  negative psych ROS   GI/Hepatic negative GI ROS, Neg liver ROS,   Endo/Other  negative endocrine ROS  Renal/GU negative Renal ROS  negative genitourinary   Musculoskeletal negative musculoskeletal ROS (+)   Abdominal   Peds  Hematology negative hematology ROS (+)   Anesthesia Other Findings AUB  Reproductive/Obstetrics                            Anesthesia Physical Anesthesia Plan  ASA: II  Anesthesia Plan: General   Post-op Pain Management:    Induction: Intravenous  PONV Risk Score and Plan: 3 and Ondansetron, Dexamethasone and Midazolam  Airway Management Planned: LMA  Additional Equipment:   Intra-op Plan:   Post-operative Plan: Extubation in OR  Informed Consent: I have reviewed the patients History and Physical, chart, labs and discussed the procedure including the risks, benefits and alternatives for the proposed anesthesia with the patient or authorized representative who has indicated his/her understanding and acceptance.     Dental advisory given  Plan Discussed with: CRNA  Anesthesia Plan Comments:         Anesthesia Quick Evaluation

## 2019-07-19 NOTE — Interval H&P Note (Signed)
History and Physical Interval Note:  07/19/2019 12:24 PM  Ashley [name]  has presented today for surgery, with the diagnosis of N93.9 abnormal uterine bleeding.  The various methods of treatment have been discussed with the patient and family. After consideration of risks, benefits and other options for treatment, the patient has consented to  Procedure(s): DILATATION & CURETTAGE/HYSTEROSCOPY WITH HYDROTHERMAL ABLATION AND MYOMECTOMY (N/A) as a surgical intervention.  The patient's history has been reviewed, patient examined, no change in status, stable for surgery.  I have reviewed the patient's chart and labs.  Questions were answered to the patient's satisfaction.     Thurnell Lose

## 2019-07-19 NOTE — Anesthesia Procedure Notes (Signed)
Procedure Name: LMA Insertion Date/Time: 07/19/2019 12:35 PM Performed by: Justice Rocher, CRNA Pre-anesthesia Checklist: Patient identified, Emergency Drugs available, Suction available and Patient being monitored Patient Re-evaluated:Patient Re-evaluated prior to induction Oxygen Delivery Method: Circle system utilized Preoxygenation: Pre-oxygenation with 100% oxygen Induction Type: IV induction Ventilation: Mask ventilation without difficulty LMA: LMA inserted LMA Size: 4.0 Number of attempts: 1 Airway Equipment and Method: Bite block Placement Confirmation: positive ETCO2,  CO2 detector and breath sounds checked- equal and bilateral Tube secured with: Tape Dental Injury: Teeth and Oropharynx as per pre-operative assessment

## 2019-07-21 NOTE — Anesthesia Postprocedure Evaluation (Signed)
Anesthesia Post Note  Patient: Ashley [name]  Procedure(s) Performed: /HYSTEROSCOPY WITH HYDROTHERMAL ABLATION AND MYOMECTOMY (N/A Vagina )     Patient location during evaluation: PACU Anesthesia Type: General Level of consciousness: awake and alert Pain management: pain level controlled Vital Signs Assessment: post-procedure vital signs reviewed and stable Respiratory status: spontaneous breathing, nonlabored ventilation, respiratory function stable and patient connected to nasal cannula oxygen Cardiovascular status: blood pressure returned to baseline and stable Postop Assessment: no apparent nausea or vomiting Anesthetic complications: no    Last Vitals:  Vitals:   07/19/19 1419 07/19/19 1540  BP:  (!) 141/88  Pulse: 70 75  Resp: 12 14  Temp: 36.7 C 36.5 C  SpO2: 100% 100%    Last Pain:  Vitals:   07/19/19 1530  TempSrc:   PainSc: 0-No pain                 Vinnie Bobst L Neville Pauls

## 2019-09-24 ENCOUNTER — Other Ambulatory Visit: Payer: Self-pay | Admitting: Family Medicine

## 2019-09-24 DIAGNOSIS — A6000 Herpesviral infection of urogenital system, unspecified: Secondary | ICD-10-CM

## 2019-09-24 NOTE — Telephone Encounter (Signed)
Requested Prescriptions  Pending Prescriptions Disp Refills  . valACYclovir (VALTREX) 500 MG tablet [Pharmacy Med Name: VALACYCLOVIR 500MG  TABLETS] 30 tablet 6    Sig: TAKE 1 TABLET(500 MG) BY MOUTH DAILY     Antimicrobials:  Antiviral Agents - Anti-Herpetic Passed - 09/24/2019  3:44 AM      Passed - Valid encounter within last 12 months    Recent Outpatient Visits          6 months ago Sore throat   [facility], NP   9 months ago Menorrhagia with regular cycle   [facility], Arlie Solomons, MD   1 year ago LLQ pain   [facility] at [facility], Gelene Mink, PA-C   1 year ago Scoliosis of lumbar spine, unspecified scoliosis type   [facility] at [facility], Gelene Mink, PA-C   2 years ago Genital herpes simplex, unspecified site   [facility] at [facility], [place], [place]

## 2020-01-02 ENCOUNTER — Other Ambulatory Visit: Payer: Self-pay

## 2020-01-02 ENCOUNTER — Ambulatory Visit (HOSPITAL_COMMUNITY)
Admission: EM | Admit: 2020-01-02 | Discharge: 2020-01-02 | Disposition: A | Discharge status: Home / Self Care | Payer: BC Managed Care – PPO | Attending: Physician Assistant | Admitting: Physician Assistant

## 2020-01-02 ENCOUNTER — Encounter (HOSPITAL_COMMUNITY): Payer: Self-pay

## 2020-01-02 DIAGNOSIS — Z79899 Other long term (current) drug therapy: Secondary | ICD-10-CM | POA: Insufficient documentation

## 2020-01-02 DIAGNOSIS — R05 Cough: Secondary | ICD-10-CM | POA: Diagnosis present

## 2020-01-02 DIAGNOSIS — J069 Acute upper respiratory infection, unspecified: Secondary | ICD-10-CM | POA: Insufficient documentation

## 2020-01-02 DIAGNOSIS — U071 COVID-19: Secondary | ICD-10-CM | POA: Insufficient documentation

## 2020-01-02 LAB — POCT RAPID STREP A, ED / UC: Streptococcus, Group A Screen (Direct): NEGATIVE

## 2020-01-02 MED ORDER — BENZONATATE 100 MG PO CAPS: 100.0000 mg | ORAL_CAPSULE | Freq: Three times a day (TID) | ORAL | 0 refills | Status: AC

## 2020-01-02 MED ORDER — ACETAMINOPHEN 325 MG PO TABS: 650.0000 mg | ORAL_TABLET | Freq: Four times a day (QID) | ORAL | 0 refills | Status: AC | PRN

## 2020-01-02 MED ORDER — FLUTICASONE PROPIONATE 50 MCG/ACT NA SUSP: 1.0000 | Freq: Every day | NASAL | 0 refills | Status: AC

## 2020-01-02 NOTE — Discharge Instructions (Signed)
Take medications as prescribed  If symptoms became severe or more concerning return or go to the emergency department  If your Covid-19 test is positive, you will receive a phone call from [facility] regarding your results. Negative test results are not called. Both positive and negative results area always visible on MyChart. If you do not have a MyChart account, sign up instructions are in your discharge papers.   Persons who are directed to care for themselves at home may discontinue isolation under the following conditions:   At least 10 days have passed since symptom onset and  At least 24 hours have passed without running a fever (this means without the use of fever-reducing medications) and  Other symptoms have improved.  Persons infected with COVID-19 who never develop symptoms may discontinue isolation and other precautions 10 days after the date of their first positive COVID-19 test.

## 2020-01-02 NOTE — ED Triage Notes (Signed)
Pt c/o productive cough w/clear mucous, fever 101F at highest and sore throat since yesterday. Pt states she was exposed to [place] + person.

## 2020-01-02 NOTE — ED Provider Notes (Signed)
[facility]    CSN: [id number] Arrival date & time: 01/02/20  7209      History   Chief Complaint Chief Complaint  Patient presents with  . Cough    HPI Ashley [name] is a 34 y.o. female.   Patient presents for cough, body ache and chills.  She reports symptoms started few days ago.  Cough has had some clear sputum.  Denies difficulty breathing.  Denies chest pain.  Denies abdominal pain, nausea or vomiting.  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  She also had exposure to mono.  Low-grade temperatures at home.  Urinating and moving her bowels per usual.  No change in taste or smell.  Denies any significant sore throat.  She states she requested strep test and clinical staff as she has had strep present this was in the past.     Past Medical History:  Diagnosis Date  . Abnormal uterine bleeding (AUB)   . Genital herpes    HSV II.  Marland Kitchen History of chlamydia 2010  . Iron deficiency anemia   . Scoliosis     Patient Active Problem List   Diagnosis Date Noted  . Scoliosis of lumbar spine 02/16/2018  . S/P tubal ligation 03/28/2013  . Fetal arrhythmia 02/01/2013    Past Surgical History:  Procedure Laterality Date  . DILITATION & CURRETTAGE/HYSTROSCOPY WITH HYDROTHERMAL ABLATION N/A 07/19/2019   Procedure: [place] WITH HYDROTHERMAL ABLATION AND MYOMECTOMY;  Surgeon: Thurnell Lose, MD;  Location: [facility];  Service: Gynecology;  Laterality: N/A;  . MYRINGOTOMY WITH TUBE PLACEMENT Bilateral child  . TUBAL LIGATION Bilateral 03/28/2013   Procedure: POST PARTUM TUBAL LIGATION;  Surgeon: Thornell Sartorius, MD;  Location: [facility];  Service: Gynecology;  Laterality: Bilateral;    OB History    Gravida  2   Para  2   Term  2   Preterm      AB      Living  2     SAB      TAB      Ectopic      Multiple      Live Births  2            Home Medications    Prior to Admission medications    Medication Sig Start Date End Date Taking? Authorizing Provider  acetaminophen (TYLENOL) 325 MG tablet Take 2 tablets (650 mg total) by mouth every 6 (six) hours as needed. 01/02/20   Arrick Dutton, Marguerita Beards, PA-C  Ascorbic Acid Buffered (BUFFERED VITAMIN C) 1000 MG CAPS Take by mouth daily.    [provider]  benzonatate (TESSALON) 100 MG capsule Take 1 capsule (100 mg total) by mouth every 8 (eight) hours. 01/02/20   Nautia Lem, Marguerita Beards, PA-C  Cholecalciferol (VITAMIN D3) 50 MCG (2000 UT) TABS Take 3 tablets by mouth daily.    [provider]  Cyanocobalamin (B-12) 2500 MCG TABS Take 1 tablet by mouth daily.    [provider]  Ferrous Fumarate (IRON) 18 MG TBCR Take 1 tablet by mouth daily.    [provider]  fluticasone (FLONASE) 50 MCG/ACT nasal spray Place 1 spray into both nostrils daily. 01/02/20   Baylynn Shifflett, Marguerita Beards, PA-C  ibuprofen (ADVIL) 800 MG tablet Take 1 tablet (800 mg total) by mouth every 8 (eight) hours as needed. 07/19/19   Thurnell Lose, MD  Multiple Vitamins-Minerals (MULTIVITAMIN WOMEN PO) Take by mouth daily.    [provider]  oxyCODONE (ROXICODONE) 5 MG immediate release tablet Take 1 tablet (5 mg total) by mouth every 4 (four) hours as needed for severe pain. 07/19/19   Thurnell Lose, MD  valACYclovir (VALTREX) 500 MG tablet TAKE 1 TABLET(500 MG) BY MOUTH DAILY 09/24/19   Forrest Moron, MD    Family History Family History  Problem Relation Age of Onset  . Hypertension Mother   . Asthma Mother   . Hypertension Maternal Grandmother   . Hypertension Paternal Grandfather   . Heart disease Paternal Grandfather     Social History Social History   Tobacco Use  . Smoking status: Never Smoker  . Smokeless tobacco: Never Used  Vaping Use  . Vaping Use: Never used  Substance Use Topics  . Alcohol use: Yes    Alcohol/week: 7.0 standard drinks    Types: 7 Glasses of wine per week    Comment: one wine daily  . Drug use: Never      Allergies   Patient has no known allergies.   Review of Systems Review of Systems   Physical Exam Triage Vital Signs ED Triage Vitals [01/02/20 2004]  Enc Vitals Group     BP 120/79     Pulse Rate 95     Resp 16     Temp 99 F (37.2 C)     Temp Source Oral     SpO2 99 %     Weight 165 lb (74.8 kg)     Height 5\' 6"  (1.676 m)     Head Circumference      Peak Flow      Pain Score 0     Pain Loc      Pain Edu?      Excl. in [place]?    No data found.  Updated Vital Signs BP 120/79   Pulse 95   Temp 99 F (37.2 C) (Oral)   Resp 16   Ht 5\' 6"  (1.676 m)   Wt 165 lb (74.8 kg)   SpO2 99%   BMI 26.63 kg/m   Visual Acuity Right Eye Distance:   Left Eye Distance:   Bilateral Distance:    Right Eye Near:   Left Eye Near:    Bilateral Near:     Physical Exam Vitals and nursing note reviewed.  Constitutional:      General: She is not in acute distress.    Appearance: She is well-developed. She is not ill-appearing.  HENT:     Head: Normocephalic and atraumatic.     Nose: Congestion and rhinorrhea present.     Mouth/Throat:     Mouth: Mucous membranes are moist.     Pharynx: No oropharyngeal exudate or posterior oropharyngeal erythema.  Eyes:     Conjunctiva/sclera: Conjunctivae normal.  Cardiovascular:     Rate and Rhythm: Normal rate and regular rhythm.     Heart sounds: No murmur heard.   Pulmonary:     Effort: Pulmonary effort is normal. No respiratory distress.     Breath sounds: Normal breath sounds. No wheezing, rhonchi or rales.  Abdominal:     Palpations: Abdomen is soft.     Tenderness: There is no abdominal tenderness.  Musculoskeletal:     Cervical back: Neck supple.  Skin:    General: Skin is warm and dry.  Neurological:     Mental Status: She is alert.      UC Treatments / Results  Labs (all labs ordered are listed, but only abnormal  results are displayed) Labs Reviewed  SARS CORONAVIRUS 2 (TAT 6-24 HRS) - Abnormal; Notable for  the following components:      Result Value   SARS Coronavirus 2 POSITIVE (*)    All other components within normal limits  CULTURE, GROUP A STREP [facility])  POCT RAPID STREP A, ED / UC    EKG   Radiology No results found.  Procedures Procedures (including critical care time)  Medications Ordered in UC Medications - No data to display  Initial Impression / Assessment and Plan / UC Course  I have reviewed the triage vital signs and the nursing notes.  Pertinent labs & imaging results that were available during my care of the patient were reviewed by me and considered in my medical decision making (see chart for details).     #Viral URI with cough Patient is 34 year old presenting with viral upper respiratory infection.  Strep negative.  Covid sent.  Reassuring vital signs and exam today.  Discussed with patient that given short duration of symptoms, mono testing would likely be negative and that would not change management.  Return, follow-up and emergency department precautions discussed.  Patient verbalized understand plan of care. Final Clinical Impressions(s) / UC Diagnoses   Final diagnoses:  Viral URI with cough     Discharge Instructions     Take medications as prescribed  If symptoms became severe or more concerning return or go to the emergency department  If your Covid-19 test is positive, you will receive a phone call from [facility] regarding your results. Negative test results are not called. Both positive and negative results area always visible on MyChart. If you do not have a MyChart account, sign up instructions are in your discharge papers.   Persons who are directed to care for themselves at home may discontinue isolation under the following conditions:  . At least 10 days have passed since symptom onset and . At least 24 hours have passed without running a fever (this means without the use of fever-reducing medications) and . Other symptoms have  improved.  Persons infected with COVID-19 who never develop symptoms may discontinue isolation and other precautions 10 days after the date of their first positive COVID-19 test.     ED Prescriptions    Medication Sig Dispense Auth. Provider   benzonatate (TESSALON) 100 MG capsule Take 1 capsule (100 mg total) by mouth every 8 (eight) hours. 21 capsule Gabriell Daigneault, Marguerita Beards, PA-C   fluticasone (FLONASE) 50 MCG/ACT nasal spray Place 1 spray into both nostrils daily. 15.8 mL Umer Harig, Marguerita Beards, PA-C   acetaminophen (TYLENOL) 325 MG tablet Take 2 tablets (650 mg total) by mouth every 6 (six) hours as needed. 30 tablet Conrad Zajkowski, Marguerita Beards, PA-C     PDMP not reviewed this encounter.   Purnell Shoemaker, PA-C 01/03/20 [phone]

## 2020-01-03 ENCOUNTER — Telehealth: Payer: Self-pay | Admitting: Emergency Medicine

## 2020-01-03 LAB — SARS CORONAVIRUS 2 (TAT 6-24 HRS): SARS Coronavirus 2: POSITIVE — AB

## 2020-01-03 NOTE — Telephone Encounter (Signed)
Your test for COVID-19 was positive, meaning that you were infected with the novel coronavirus and could give the germ to others.  Please continue isolation at home for at least 10 days since the start of your symptoms. If you do not have symptoms, please isolate at home for 10 days from the day you were tested. Once you complete your 10 day quarantine, you may return to normal activities as long as you've not had a fever for over 24 hours(without taking fever reducing medicine) and your symptoms are improving. Please continue good preventive care measures, including:  frequent hand-washing, avoid touching your face, cover coughs/sneezes, stay out of crowds and keep a 6 foot distance from others.  Go to the nearest hospital emergency room if fever/cough/breathlessness are severe or illness seems like a threat to life.  Attempted to call pt x 1 no answer [facility] and sent mychart message

## 2020-01-05 LAB — CULTURE, GROUP A STREP (THRC)

## 2020-05-08 ENCOUNTER — Other Ambulatory Visit: Payer: Self-pay

## 2020-05-08 ENCOUNTER — Ambulatory Visit (HOSPITAL_COMMUNITY)
Admission: EM | Admit: 2020-05-08 | Discharge: 2020-05-08 | Disposition: A | Discharge status: Home / Self Care | Payer: BC Managed Care – PPO

## 2020-07-26 IMAGING — DX CHEST - 2 VIEW
2 series · 2 of 2 positions shown · non-contrast
Comparison: None.

CLINICAL DATA: Chest pain.

EXAM:
CHEST - 2 VIEW

[chest pa]
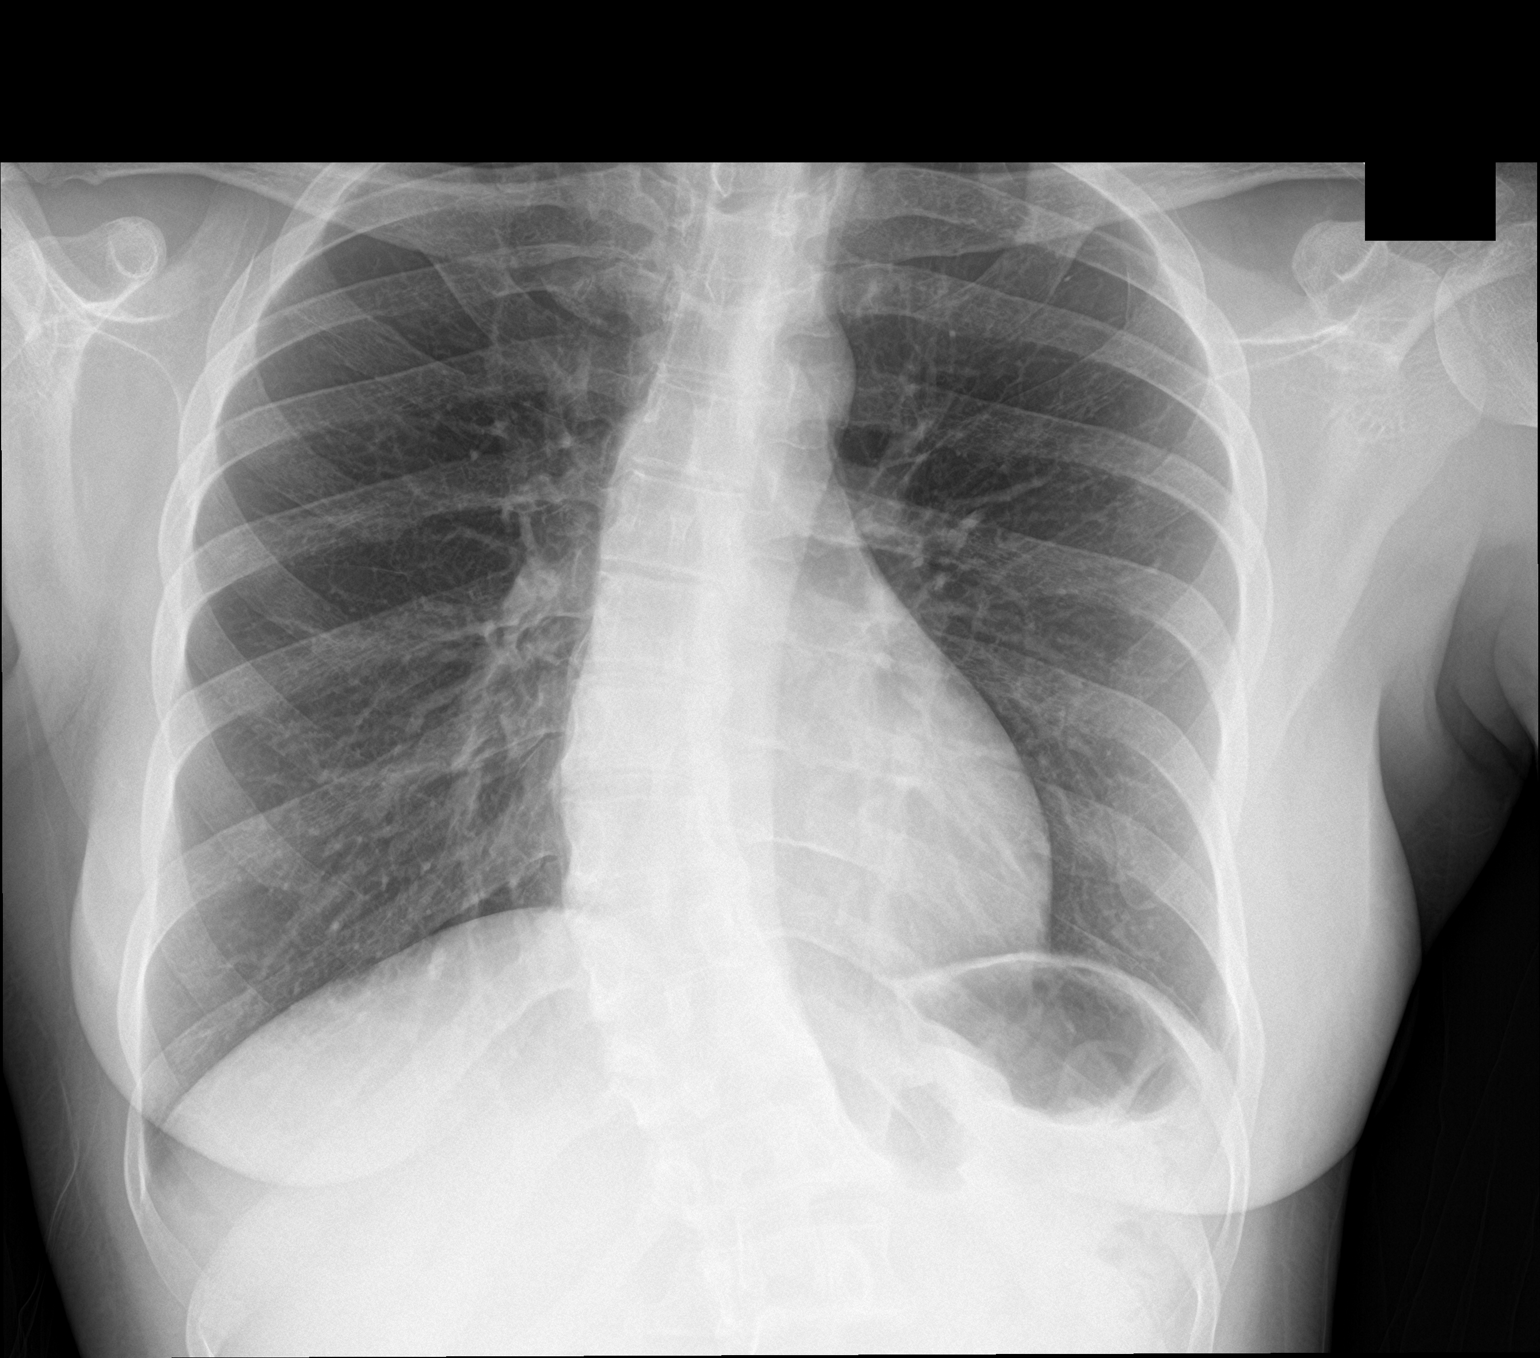

[chest lat]
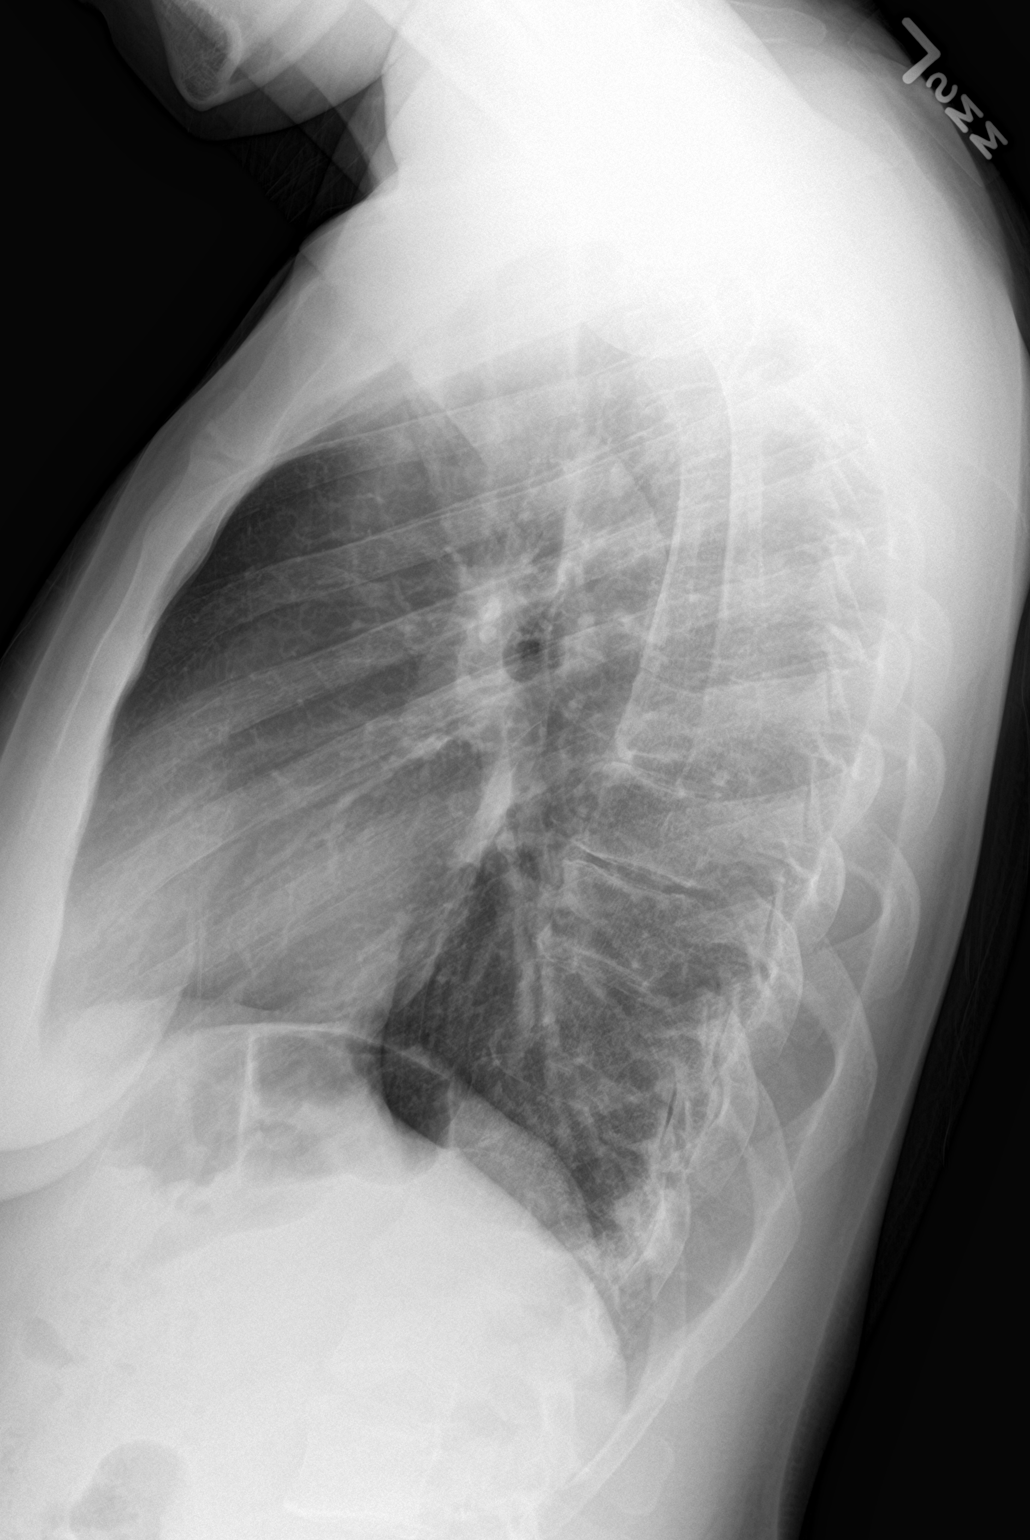

[2 of 2 positions shown; findings below may reference images not displayed]

FINDINGS: The heart size and mediastinal contours are within normal limits.
Both lungs are clear. No pneumothorax or pleural effusion is noted.
The visualized skeletal structures are unremarkable.
IMPRESSION: No active cardiopulmonary disease.

## 2020-09-23 ENCOUNTER — Other Ambulatory Visit: Payer: Self-pay | Admitting: Family Medicine

## 2020-09-23 DIAGNOSIS — N6320 Unspecified lump in the left breast, unspecified quadrant: Secondary | ICD-10-CM
